# Patient Record
Sex: Female | Born: 1990 | Race: White | Hispanic: No | Marital: Single | Smoking: Current every day smoker
Health system: Southern US, Community
[De-identification: ages and names within clinical notes are randomized; demographics above are authoritative.]

## PROBLEM LIST (undated history)

## (undated) DIAGNOSIS — B1920 Unspecified viral hepatitis C without hepatic coma: Secondary | ICD-10-CM

## (undated) HISTORY — PX: CHOLECYSTECTOMY: SHX55

## (undated) HISTORY — PX: TOOTH EXTRACTION: SUR596

---
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
## 2012-05-02 ENCOUNTER — Encounter (HOSPITAL_COMMUNITY): Payer: Self-pay | Admitting: *Deleted

## 2012-05-02 ENCOUNTER — Emergency Department (HOSPITAL_COMMUNITY)
Admission: EM | Admit: 2012-05-02 | Discharge: 2012-05-02 | Disposition: A | Discharge status: Home / Self Care | Payer: Medicaid Other | Attending: Emergency Medicine | Admitting: Emergency Medicine

## 2012-05-02 ENCOUNTER — Emergency Department (HOSPITAL_COMMUNITY): Payer: Medicaid Other

## 2012-05-02 DIAGNOSIS — M79672 Pain in left foot: Secondary | ICD-10-CM

## 2012-05-02 DIAGNOSIS — M79609 Pain in unspecified limb: Secondary | ICD-10-CM | POA: Insufficient documentation

## 2012-05-02 DIAGNOSIS — X500XXA Overexertion from strenuous movement or load, initial encounter: Secondary | ICD-10-CM | POA: Insufficient documentation

## 2012-05-02 DIAGNOSIS — F17200 Nicotine dependence, unspecified, uncomplicated: Secondary | ICD-10-CM | POA: Insufficient documentation

## 2012-05-02 DIAGNOSIS — M25572 Pain in left ankle and joints of left foot: Secondary | ICD-10-CM

## 2012-05-02 DIAGNOSIS — M25579 Pain in unspecified ankle and joints of unspecified foot: Secondary | ICD-10-CM | POA: Insufficient documentation

## 2012-05-02 MED ORDER — HYDROCODONE-ACETAMINOPHEN 5-325 MG PO TABS: 1.0000 | ORAL_TABLET | Freq: Four times a day (QID) | ORAL | Status: AC | PRN

## 2012-05-02 NOTE — ED Notes (Addendum)
Pt states left foot pain, mostly along lateral side. No known injury. Mild swelling to foot and ankle area. Pt is four days post pardum with no complications.

## 2012-05-02 NOTE — ED Provider Notes (Signed)
History     CSN: [id number]  Arrival date & time 05/02/12  1058   First MD Initiated Contact with Patient 05/02/12 1130      Chief Complaint  Patient presents with  . Foot Pain    (Consider location/radiation/quality/duration/timing/severity/associated sxs/prior treatment) HPI Comments: Pt in 4 days postpartum.  She does not recall a specific injury to the L foot and ankle but does recall jumping up and "running" when her baby cried because the dog was near the [place].  Patient is a 21 y.o. female presenting with lower extremity pain. The history is provided by the patient. No language interpreter was used.  Foot Pain This is a new problem. Episode onset: 2 days ago. The problem occurs constantly. Pertinent negatives include no numbness or weakness. The symptoms are aggravated by walking and standing. She has tried NSAIDs for the symptoms. The treatment provided mild relief.    History reviewed. No pertinent past medical history.  Past Surgical History  Procedure Date  . Cholecystectomy   . Tooth extraction     No family history on file.  History  Substance Use Topics  . Smoking status: Current Everyday Smoker  . Smokeless tobacco: Not on file  . Alcohol Use: No    OB History    Grav Para Term Preterm Abortions TAB SAB Ect Mult Living                  Review of Systems  Musculoskeletal:       Foot and ankle pain   Neurological: Negative for weakness and numbness.  All other systems reviewed and are negative.    Allergies  Review of patient's allergies indicates no known allergies.  Home Medications  No current outpatient prescriptions on file.  BP 135/72  Pulse 87  Temp 98.5 F (36.9 C) (Oral)  Resp 16  Ht 5\' 6"  (1.676 m)  Wt 200 lb (90.719 kg)  BMI 32.28 kg/m2  SpO2 100%  LMP 05/02/2012  Physical Exam  Nursing note and vitals reviewed. Constitutional: She is oriented to person, place, and time. She appears well-developed and well-nourished.  No distress.  HENT:  Head: Normocephalic and atraumatic.  Eyes: EOM are normal.  Neck: Normal range of motion.  Cardiovascular: Normal rate, regular rhythm and normal heart sounds.   Pulmonary/Chest: Effort normal and breath sounds normal.  Abdominal: Soft. She exhibits no distension. There is no tenderness.  Musculoskeletal: She exhibits tenderness.       Left foot: She exhibits decreased range of motion, tenderness, bony tenderness and swelling. She exhibits normal capillary refill, no crepitus, no deformity and no laceration.       Feet:  Neurological: She is alert and oriented to person, place, and time.  Skin: Skin is warm and dry.  Psychiatric: She has a normal mood and affect. Judgment normal.    ED Course  Procedures (including critical care time)  Labs Reviewed - No data to display Dg Ankle Complete Left  05/02/2012  *RADIOLOGY REPORT*  Clinical Data: Lateral foot and ankle pain.  LEFT ANKLE COMPLETE - 3+ VIEW  Comparison: None.  Findings: Imaged bones, joints and soft tissues appear normal.  IMPRESSION: Negative exam.  Original Report Authenticated By: Bernadene Bell. D'ALESSIO, M.D.   Dg Foot Complete Left  05/02/2012  *RADIOLOGY REPORT*  Clinical Data: Lateral foot and ankle pain.  LEFT FOOT - COMPLETE 3+ VIEW  Comparison: None.  Findings: Imaged bones, joints and soft tissues appear normal.  IMPRESSION: Negative exam.  Original Report Authenticated By: Bernadene Bell. D'ALESSIO, M.D.     1. Left foot pain   2. Left ankle pain       MDM  No fxs.  Ice, elevate, post op shoe Ibuprofen 800 mg TID.  F/u with dr. Romeo Apple.        Evalina Field, [place] 05/02/12 1244

## 2012-05-03 NOTE — ED Provider Notes (Signed)
Medical screening examination/treatment/procedure(s) were performed by non-physician practitioner and as supervising physician I was immediately available for consultation/collaboration.   Ceceilia Cephus M Prudence Heiny, DO 05/03/12 2051 

## 2012-05-11 ENCOUNTER — Emergency Department (HOSPITAL_COMMUNITY)
Admission: EM | Admit: 2012-05-11 | Discharge: 2012-05-11 | Disposition: A | Discharge status: Home / Self Care | Payer: Medicaid Other | Attending: Emergency Medicine | Admitting: Emergency Medicine

## 2012-05-11 ENCOUNTER — Encounter (HOSPITAL_COMMUNITY): Payer: Self-pay | Admitting: *Deleted

## 2012-05-11 DIAGNOSIS — Y9389 Activity, other specified: Secondary | ICD-10-CM | POA: Insufficient documentation

## 2012-05-11 DIAGNOSIS — S335XXA Sprain of ligaments of lumbar spine, initial encounter: Secondary | ICD-10-CM | POA: Insufficient documentation

## 2012-05-11 DIAGNOSIS — X500XXA Overexertion from strenuous movement or load, initial encounter: Secondary | ICD-10-CM | POA: Insufficient documentation

## 2012-05-11 DIAGNOSIS — Y998 Other external cause status: Secondary | ICD-10-CM | POA: Insufficient documentation

## 2012-05-11 DIAGNOSIS — S39012A Strain of muscle, fascia and tendon of lower back, initial encounter: Secondary | ICD-10-CM

## 2012-05-11 DIAGNOSIS — F17200 Nicotine dependence, unspecified, uncomplicated: Secondary | ICD-10-CM | POA: Insufficient documentation

## 2012-05-11 NOTE — ED Notes (Signed)
State she bent over to pick up her baby 2 days ago and felt something pop in her back

## 2012-05-11 NOTE — ED Notes (Signed)
Pt with lower left back pain after jumping up to pick up her newborn baby, states pain radiates down left leg

## 2012-05-11 NOTE — ED Notes (Signed)
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

## 2012-05-11 NOTE — ED Provider Notes (Signed)
History     CSN: [id number]  Arrival date & time 05/11/12  1404   First MD Initiated Contact with Patient 05/11/12 1503      Chief Complaint  Patient presents with  . Back Pain    (Consider location/radiation/quality/duration/timing/severity/associated sxs/prior treatment) Patient is a 21 y.o. female presenting with back pain. The history is provided by the patient and a relative.  Back Pain  This is a new problem. The current episode started 2 days ago. The problem occurs hourly. The problem has been gradually worsening. The pain is associated with lifting heavy objects. The pain is present in the lumbar spine. The quality of the pain is described as aching and shooting. The pain is moderate. The symptoms are aggravated by certain positions. The pain is the same all the time. Pertinent negatives include no chest pain, no fever, no abdominal pain, no bowel incontinence, no perianal numbness, no bladder incontinence and no dysuria. She has tried NSAIDs for the symptoms.    History reviewed. No pertinent past medical history.  Past Surgical History  Procedure Date  . Cholecystectomy   . Tooth extraction     No family history on file.  History  Substance Use Topics  . Smoking status: Current Everyday Smoker  . Smokeless tobacco: Not on file  . Alcohol Use: No    OB History    Grav Para Term Preterm Abortions TAB SAB Ect Mult Living                  Review of Systems  Constitutional: Negative for fever and activity change.       All ROS Neg except as noted in HPI  HENT: Negative for nosebleeds and neck pain.   Eyes: Negative for photophobia and discharge.  Respiratory: Negative for cough, shortness of breath and wheezing.   Cardiovascular: Negative for chest pain and palpitations.  Gastrointestinal: Negative for abdominal pain, blood in stool and bowel incontinence.  Genitourinary: Negative for bladder incontinence, dysuria, frequency and hematuria.  Musculoskeletal:  Positive for back pain. Negative for arthralgias.  Skin: Negative.   Neurological: Negative for dizziness, seizures and speech difficulty.  Psychiatric/Behavioral: Negative for hallucinations and confusion.    Allergies  Review of patient's allergies indicates no known allergies.  Home Medications   Current Outpatient Rx  Name Route Sig Dispense Refill  . GOODY HEADACHE PO Oral Take 1 packet by mouth as needed.    . IBUPROFEN 200 MG PO TABS Oral Take 200 mg by mouth every 6 (six) hours as needed.    Marland Kitchen PRENATAL PLUS 27-1 MG PO TABS Oral Take 1 tablet by mouth daily.    Marland Kitchen HYDROCODONE-ACETAMINOPHEN 5-325 MG PO TABS Oral Take 1 tablet by mouth every 6 (six) hours as needed for pain. 20 tablet 0    BP 116/63  Pulse 99  Temp 98.9 F (37.2 C) (Oral)  Resp 20  Ht 5\' 6"  (1.676 m)  Wt 200 lb (90.719 kg)  BMI 32.28 kg/m2  SpO2 100%  LMP 05/02/2012  Physical Exam  Nursing note and vitals reviewed. Constitutional: She is oriented to person, place, and time. She appears well-developed and well-nourished.  Non-toxic appearance.  HENT:  Head: Normocephalic.  Right Ear: Tympanic membrane and external ear normal.  Left Ear: Tympanic membrane and external ear normal.  Eyes: EOM and lids are normal. Pupils are equal, round, and reactive to light.  Neck: Normal range of motion. Neck supple. Carotid bruit is not present.  Cardiovascular: Normal rate,  regular rhythm, normal heart sounds, intact distal pulses and normal pulses.   Pulmonary/Chest: Breath sounds normal. No respiratory distress.  Abdominal: Soft. Bowel sounds are normal. There is no tenderness. There is no guarding.  Musculoskeletal: Normal range of motion.       Left paraspinal tenderness to palpation, some spasm with attempted range of motion.  Lymphadenopathy:       Head (right side): No submandibular adenopathy present.       Head (left side): No submandibular adenopathy present.    She has no cervical adenopathy.    Neurological: She is alert and oriented to person, place, and time. She has normal strength. No cranial nerve deficit or sensory deficit. She exhibits normal muscle tone. Coordination normal.  Skin: Skin is warm and dry.  Psychiatric: She has a normal mood and affect. Her speech is normal.    ED Course  Procedures (including critical care time)  Labs Reviewed - No data to display No results found.   1. Lumbar strain       MDM  I have reviewed nursing notes, vital signs, and all appropriate lab and imaging results for this patient. Patient was lifting her baby over a plate pin when she felt a pop in her lower back. She's been having pain and spasm since that time. The patient was examined and there no gross neurologic deficits appreciated at this time. The patient is to alternate heat and ice to the lower back. She is to continue her ibuprofen. Muscle relaxant was offered, but the patient states she is concerned about being too drowsy to take care of her child. So the patient is going to use more rest for her back. Patient is also given a referral to orthopedics for evaluation if not improving.       Kathie Dike, [place] 05/11/12 1540

## 2012-05-12 NOTE — ED Provider Notes (Signed)
Medical screening examination/treatment/procedure(s) were performed by non-physician practitioner and as supervising physician I was immediately available for consultation/collaboration.   Carleene Cooper III, MD 05/12/12 [phone]

## 2012-07-22 ENCOUNTER — Emergency Department (HOSPITAL_COMMUNITY)
Admission: EM | Admit: 2012-07-22 | Discharge: 2012-07-22 | Disposition: A | Discharge status: Home / Self Care | Payer: Medicaid Other | Attending: Emergency Medicine | Admitting: Emergency Medicine

## 2012-07-22 ENCOUNTER — Encounter (HOSPITAL_COMMUNITY): Payer: Self-pay

## 2012-07-22 DIAGNOSIS — K0889 Other specified disorders of teeth and supporting structures: Secondary | ICD-10-CM

## 2012-07-22 DIAGNOSIS — F17200 Nicotine dependence, unspecified, uncomplicated: Secondary | ICD-10-CM | POA: Insufficient documentation

## 2012-07-22 DIAGNOSIS — K089 Disorder of teeth and supporting structures, unspecified: Secondary | ICD-10-CM | POA: Insufficient documentation

## 2012-07-22 MED ORDER — HYDROCODONE-ACETAMINOPHEN 5-325 MG PO TABS
1.0000 | ORAL_TABLET | Freq: Once | ORAL | Status: AC
  Administered 2012-07-22: 1 via ORAL
  Filled 2012-07-22: qty 1

## 2012-07-22 MED ORDER — PENICILLIN V POTASSIUM 500 MG PO TABS: 500.0000 mg | ORAL_TABLET | Freq: Four times a day (QID) | ORAL | Status: AC

## 2012-07-22 MED ORDER — PENICILLIN V POTASSIUM 250 MG PO TABS
500.0000 mg | ORAL_TABLET | Freq: Once | ORAL | Status: AC
  Administered 2012-07-22: 500 mg via ORAL
  Filled 2012-07-22: qty 2

## 2012-07-22 MED ORDER — HYDROCODONE-ACETAMINOPHEN 5-325 MG PO TABS: 1.0000 | ORAL_TABLET | Freq: Four times a day (QID) | ORAL | Status: AC | PRN

## 2012-07-22 NOTE — ED Notes (Signed)
C/o lower dental pain in front.

## 2012-07-22 NOTE — ED Provider Notes (Signed)
Medical screening examination/treatment/procedure(s) were performed by non-physician practitioner and as supervising physician I was immediately available for consultation/collaboration.  Jones Skene, M.D.     Jones Skene, MD 07/22/12 1610

## 2012-07-22 NOTE — ED Notes (Signed)
Patient with no complaints at this time. Respirations even and unlabored. Skin warm/dry. Discharge instructions reviewed with patient at this time. Patient given opportunity to voice concerns/ask questions. Patient discharged at this time and left Emergency Department with steady gait.   

## 2012-07-22 NOTE — ED Provider Notes (Signed)
History     CSN: [id number]  Arrival date & time 07/22/12  1208   First MD Initiated Contact with Patient 07/22/12 1405      Chief Complaint  Patient presents with  . Dental Pain    (Consider location/radiation/quality/duration/timing/severity/associated sxs/prior treatment) HPI Comments: States she is planning to call dr. Barbette Merino , oral surgeon tomorrow.  No fever or chills.  taking #2 800 mg ibuprofen every 8 hrs with no relief.  Patient is a 21 y.o. female presenting with tooth pain. The history is provided by the patient. No language interpreter was used.  Dental PainThe primary symptoms include mouth pain. Primary symptoms do not include dental injury. Episode onset: several days. The symptoms are unchanged. The symptoms occur constantly.  Additional symptoms do not include: facial swelling and trouble swallowing.    History reviewed. No pertinent past medical history.  Past Surgical History  Procedure Date  . Cholecystectomy   . Tooth extraction     No family history on file.  History  Substance Use Topics  . Smoking status: Current Every Day Smoker  . Smokeless tobacco: Not on file  . Alcohol Use: No    OB History    Grav Para Term Preterm Abortions TAB SAB Ect Mult Living                  Review of Systems  HENT: Positive for dental problem. Negative for facial swelling and trouble swallowing.   Cardiovascular: Negative for chest pain.  All other systems reviewed and are negative.    Allergies  Review of patient's allergies indicates no known allergies.  Home Medications   Current Outpatient Rx  Name Route Sig Dispense Refill  . GOODY HEADACHE PO Oral Take 1 packet by mouth every 4 (four) hours as needed. Pain    . ESCITALOPRAM OXALATE 10 MG PO TABS Oral Take 10 mg by mouth daily.    . IBUPROFEN 200 MG PO TABS Oral Take 400 mg by mouth every 6 (six) hours as needed. Pain    . HYDROCODONE-ACETAMINOPHEN 5-325 MG PO TABS Oral Take 1 tablet by mouth  every 6 (six) hours as needed for pain. 20 tablet 0  . PENICILLIN V POTASSIUM 500 MG PO TABS Oral Take 1 tablet (500 mg total) by mouth 4 (four) times daily. 40 tablet 0    BP 129/96  Pulse 98  Temp 98.8 F (37.1 C) (Oral)  Resp 17  Ht 5\' 6"  (1.676 m)  Wt 200 lb (90.719 kg)  BMI 32.28 kg/m2  SpO2 100%  LMP 06/28/2012  Physical Exam  Nursing note and vitals reviewed. Constitutional: She is oriented to person, place, and time. She appears well-developed and well-nourished. No distress.  HENT:  Head: Normocephalic and atraumatic.  Mouth/Throat: Uvula is midline, oropharynx is clear and moist and mucous membranes are normal. Dental caries present. No dental abscesses or uvula swelling.    Eyes: EOM are normal.  Neck: Normal range of motion.  Cardiovascular: Normal rate and regular rhythm.   Pulmonary/Chest: Effort normal.  Abdominal: Soft. She exhibits no distension. There is no tenderness.  Musculoskeletal: Normal range of motion.  Neurological: She is alert and oriented to person, place, and time.  Skin: Skin is warm and dry.  Psychiatric: She has a normal mood and affect. Judgment normal.    ED Course  Procedures (including critical care time)  Labs Reviewed - No data to display No results found.   1. Pain, dental  MDM  rx-pen VK 500 mg QID, 40 rx-hydrocodone, 20 Ibuprofen F/u with dr. Hamilton Capri, PA 07/22/12 [phone]

## 2012-11-27 ENCOUNTER — Emergency Department (HOSPITAL_COMMUNITY)
Admission: EM | Admit: 2012-11-27 | Discharge: 2012-11-27 | Disposition: A | Discharge status: Home / Self Care | Payer: Medicaid Other | Attending: Emergency Medicine | Admitting: Emergency Medicine

## 2012-11-27 ENCOUNTER — Emergency Department (HOSPITAL_COMMUNITY): Payer: Medicaid Other

## 2012-11-27 ENCOUNTER — Encounter (HOSPITAL_COMMUNITY): Payer: Self-pay | Admitting: *Deleted

## 2012-11-27 DIAGNOSIS — F17200 Nicotine dependence, unspecified, uncomplicated: Secondary | ICD-10-CM | POA: Insufficient documentation

## 2012-11-27 DIAGNOSIS — S39012A Strain of muscle, fascia and tendon of lower back, initial encounter: Secondary | ICD-10-CM

## 2012-11-27 DIAGNOSIS — W010XXA Fall on same level from slipping, tripping and stumbling without subsequent striking against object, initial encounter: Secondary | ICD-10-CM | POA: Insufficient documentation

## 2012-11-27 DIAGNOSIS — Z79899 Other long term (current) drug therapy: Secondary | ICD-10-CM | POA: Insufficient documentation

## 2012-11-27 DIAGNOSIS — S335XXA Sprain of ligaments of lumbar spine, initial encounter: Secondary | ICD-10-CM | POA: Insufficient documentation

## 2012-11-27 DIAGNOSIS — Y929 Unspecified place or not applicable: Secondary | ICD-10-CM | POA: Insufficient documentation

## 2012-11-27 DIAGNOSIS — T148XXA Other injury of unspecified body region, initial encounter: Secondary | ICD-10-CM

## 2012-11-27 DIAGNOSIS — Y939 Activity, unspecified: Secondary | ICD-10-CM | POA: Insufficient documentation

## 2012-11-27 MED ORDER — HYDROCODONE-ACETAMINOPHEN 5-325 MG PO TABS: 1.0000 | ORAL_TABLET | ORAL | Status: DC | PRN

## 2012-11-27 NOTE — ED Provider Notes (Signed)
History     CSN: [id number]  Arrival date & time 11/27/12  1500   None     Chief Complaint  Patient presents with  . Fall  . Back Pain    HPI Allison Love is a 22 y.o. female who presents to the ED with back pain. The onset was sudden. She slipped on ice this morning and started to fall, she caught herself and did not land on the the ground. Denies head injury or LOC. Denies nausea or vomiting. Severe pain in lower back.   History reviewed. No pertinent past medical history.  Past Surgical History  Procedure Laterality Date  . Cholecystectomy    . Tooth extraction      History reviewed. No pertinent family history.  History  Substance Use Topics  . Smoking status: Current Every Day Smoker  . Smokeless tobacco: Not on file  . Alcohol Use: No    OB History   Grav Para Term Preterm Abortions TAB SAB Ect Mult Living                  Review of Systems  Constitutional: Negative for fever and chills.  HENT: Negative for neck pain.   Eyes: Negative for visual disturbance.  Respiratory: Negative for cough.   Cardiovascular: Negative for chest pain and palpitations.  Gastrointestinal: Negative for nausea, vomiting and abdominal pain.  Musculoskeletal: Positive for back pain.  Skin: Negative for wound.  Neurological: Negative for dizziness and headaches.  Psychiatric/Behavioral: Negative for confusion. The patient is not nervous/anxious.     Allergies  Review of patient's allergies indicates no known allergies.  Home Medications   Current Outpatient Rx  Name  Route  Sig  Dispense  Refill  . Aspirin-Acetaminophen-Caffeine (GOODY HEADACHE PO)   Oral   Take 1 packet by mouth every 4 (four) hours as needed. Pain         . busPIRone (BUSPAR) 15 MG tablet   Oral   Take 15 mg by mouth 2 (two) times daily.         . cyclobenzaprine (FLEXERIL) 10 MG tablet   Oral   Take 10 mg by mouth 3 (three) times daily as needed for muscle spasms.         Marland Kitchen  escitalopram (LEXAPRO) 10 MG tablet   Oral   Take 10 mg by mouth daily.         Marland Kitchen ibuprofen (ADVIL,MOTRIN) 200 MG tablet   Oral   Take 400 mg by mouth every 6 (six) hours as needed. Pain         . topiramate (TOPAMAX) 50 MG tablet   Oral   Take 50 mg by mouth 2 (two) times daily.           BP 119/66  Pulse 86  Temp(Src) 97.9 F (36.6 C) (Oral)  Resp 20  Ht 5\' 6"  (1.676 m)  Wt 250 lb (113.399 kg)  BMI 40.37 kg/m2  SpO2 100%  LMP 11/22/2012  Physical Exam  Nursing note and vitals reviewed. Constitutional: She is oriented to person, place, and time. She appears well-developed and well-nourished.  HENT:  Head: Normocephalic and atraumatic.  Eyes: EOM are normal. Pupils are equal, round, and reactive to light.  Neck: Neck supple.  Cardiovascular: Normal rate.   Pulmonary/Chest: Effort normal.  Abdominal: Soft. There is no tenderness.  Musculoskeletal:  Tender with palpation left lumbar area. Pain increases with range of motion. Pain with straight leg raises left. Pedal pulses strong  and equal bilateral, adequate circulation, good touch sensation. Normal reflexes.  Neurological: She is alert and oriented to person, place, and time. She has normal strength and normal reflexes. No cranial nerve deficit or sensory deficit.  Ambulatory without difficulty.  Skin: Skin is warm and dry.  Psychiatric: She has a normal mood and affect. Her behavior is normal. Judgment and thought content normal.    Procedures  Dg Lumbar Spine Complete  11/27/2012  *RADIOLOGY REPORT*  Clinical Data: Fall.  Back pain.  LUMBAR SPINE - COMPLETE 4+ VIEW  Comparison: None.  Findings: Minimal curvature of the lumbar spine.  No fracture detected.  No significant disc space narrowing.  Left renal calculi suspected.  Prior cholecystectomy.  IMPRESSION: Minimal curvature of the lumbar spine.  No fracture detected.  No significant disc space narrowing.  Left renal calculi suspected.   Original Report  Authenticated By: Lacy Duverney, M.D.     Assessment: 22 y.o. female with low back strain   Renal calculi as incidental finding on x-ray  Plan:  Motrin 600 mg  And Flexeril (patient has at home)   Hydrocodone Rx I have reviewed this patient's vital signs, nurses notes and imaging.  I discussed findings with the patient and plan of care. Patient voices understanding.   Medication List    TAKE these medications       HYDROcodone-acetaminophen 5-325 MG per tablet  Commonly known as:  NORCO/VICODIN  Take 1 tablet by mouth every 4 (four) hours as needed for pain.      ASK your doctor about these medications       busPIRone 15 MG tablet  Commonly known as:  BUSPAR  Take 15 mg by mouth 2 (two) times daily.     cyclobenzaprine 10 MG tablet  Commonly known as:  FLEXERIL  Take 10 mg by mouth 3 (three) times daily as needed for muscle spasms.     escitalopram 10 MG tablet  Commonly known as:  LEXAPRO  Take 10 mg by mouth daily.     GOODY HEADACHE PO  Take 1 packet by mouth every 4 (four) hours as needed. Pain     ibuprofen 200 MG tablet  Commonly known as:  ADVIL,MOTRIN  Take 400 mg by mouth every 6 (six) hours as needed. Pain     topiramate 50 MG tablet  Commonly known as:  TOPAMAX  Take 50 mg by mouth 2 (two) times daily.            [facility] Orlene Och, [place] 11/27/12 [phone]

## 2012-11-27 NOTE — ED Notes (Signed)
Pt slipped on ice this morning and fell, caught self and did not land on ground, denies hitting her head, c/o lower back pain

## 2012-11-27 NOTE — ED Notes (Signed)
Pt c/o lower back pain that began after she slipped on ice and fell this morning. Pt was ambulatory on arrival.

## 2012-11-27 NOTE — ED Provider Notes (Signed)
Medical screening examination/treatment/procedure(s) were performed by non-physician practitioner and as supervising physician I was immediately available for consultation/collaboration.   Donald W Wickline, MD 11/27/12 1918 

## 2013-01-21 ENCOUNTER — Encounter (HOSPITAL_COMMUNITY): Payer: Self-pay | Admitting: *Deleted

## 2013-01-21 ENCOUNTER — Emergency Department (HOSPITAL_COMMUNITY)
Admission: EM | Admit: 2013-01-21 | Discharge: 2013-01-21 | Disposition: A | Discharge status: Home / Self Care | Payer: Medicaid Other | Attending: Emergency Medicine | Admitting: Emergency Medicine

## 2013-01-21 DIAGNOSIS — K0889 Other specified disorders of teeth and supporting structures: Secondary | ICD-10-CM

## 2013-01-21 DIAGNOSIS — K089 Disorder of teeth and supporting structures, unspecified: Secondary | ICD-10-CM | POA: Insufficient documentation

## 2013-01-21 DIAGNOSIS — K029 Dental caries, unspecified: Secondary | ICD-10-CM

## 2013-01-21 DIAGNOSIS — Z79899 Other long term (current) drug therapy: Secondary | ICD-10-CM | POA: Insufficient documentation

## 2013-01-21 DIAGNOSIS — K08109 Complete loss of teeth, unspecified cause, unspecified class: Secondary | ICD-10-CM | POA: Insufficient documentation

## 2013-01-21 DIAGNOSIS — F17200 Nicotine dependence, unspecified, uncomplicated: Secondary | ICD-10-CM | POA: Insufficient documentation

## 2013-01-21 MED ORDER — PENICILLIN V POTASSIUM 500 MG PO TABS: 1000.0000 mg | ORAL_TABLET | Freq: Two times a day (BID) | ORAL | Status: DC

## 2013-01-21 MED ORDER — HYDROCODONE-ACETAMINOPHEN 5-325 MG PO TABS: 2.0000 | ORAL_TABLET | ORAL | Status: DC | PRN

## 2013-01-21 NOTE — ED Provider Notes (Signed)
History     CSN: [id number]  Arrival date & time 01/21/13  1714   First MD Initiated Contact with Patient 01/21/13 1739      Chief Complaint  Patient presents with  . Dental Pain    (Consider location/radiation/quality/duration/timing/severity/associated sxs/prior treatment) HPI This 22 year old female has had multiple dental extractions from decay in the past, she does have some mandibular incisors with chronic dental caries usually do not hurt her, over the last several days she has constant pain worse with chewing in her mandibular dental incisors with tenderness to percussion and palpation but no trismus no drooling no neck swelling no fever no voice change no shortness of breath no trauma and the patient states no recent antibiotics, she states her dentist will not see her until she sees an oral surgeon and she states that oral surg appointment is within 2 weeks. There is no treatment prior to arrival her pain is severe. History reviewed. No pertinent past medical history.  Past Surgical History  Procedure Laterality Date  . Cholecystectomy    . Tooth extraction      History reviewed. No pertinent family history.  History  Substance Use Topics  . Smoking status: Current Every Day Smoker  . Smokeless tobacco: Not on file  . Alcohol Use: No    OB History   Grav Para Term Preterm Abortions TAB SAB Ect Mult Living                  Review of Systems 10 Systems reviewed and are negative for acute change except as noted in the HPI. Allergies  Review of patient's allergies indicates no known allergies.  Home Medications   Current Outpatient Rx  Name  Route  Sig  Dispense  Refill  . Aspirin-Acetaminophen-Caffeine (GOODY HEADACHE PO)   Oral   Take 1 packet by mouth every 4 (four) hours as needed. Pain         . busPIRone (BUSPAR) 15 MG tablet   Oral   Take 15 mg by mouth 2 (two) times daily.         . cyclobenzaprine (FLEXERIL) 10 MG tablet   Oral   Take 10  mg by mouth 3 (three) times daily as needed for muscle spasms.         Marland Kitchen escitalopram (LEXAPRO) 10 MG tablet   Oral   Take 10 mg by mouth daily.         Marland Kitchen HYDROcodone-acetaminophen (NORCO) 5-325 MG per tablet   Oral   Take 2 tablets by mouth every 4 (four) hours as needed for pain.   10 tablet   0   . HYDROcodone-acetaminophen (NORCO/VICODIN) 5-325 MG per tablet   Oral   Take 1 tablet by mouth every 4 (four) hours as needed for pain.   15 tablet   0   . ibuprofen (ADVIL,MOTRIN) 200 MG tablet   Oral   Take 400 mg by mouth every 6 (six) hours as needed. Pain         . penicillin v potassium (VEETID) 500 MG tablet   Oral   Take 2 tablets (1,000 mg total) by mouth 2 (two) times daily. X 7 days   28 tablet   0   . topiramate (TOPAMAX) 50 MG tablet   Oral   Take 50 mg by mouth 2 (two) times daily.           BP 118/78  Pulse 77  Temp(Src) 98.8 F (37.1 C) (Oral)  Resp  18  Ht 5\' 6"  (1.676 m)  Wt 250 lb (113.399 kg)  BMI 40.37 kg/m2  SpO2 100%  LMP 01/14/2013  Physical Exam  Nursing note and vitals reviewed. Constitutional:  Awake, alert, nontoxic appearance.  HENT:  Head: Atraumatic.  Mouth/Throat: Oropharynx is clear and moist.  Prior multiple dental extractions, the patient wears upper dentures, her remaining right mandibular molars are nontender she does have some remaining mandibular incisors which are eroded away from decay with multiple dental caries of the remaining 4 mandibular incisors with percussion tenderness and localized gingival tenderness without purulence drainage or fluctuance no drooling no stridor no trismus her tongue appears normal  Eyes: Right eye exhibits no discharge. Left eye exhibits no discharge.  Neck: Neck supple.  Cardiovascular: Normal rate and regular rhythm.   No murmur heard. Pulmonary/Chest: Effort normal and breath sounds normal. No respiratory distress. She has no wheezes. She has no rales. She exhibits no tenderness.   Abdominal: Soft. There is no tenderness. There is no rebound.  Musculoskeletal: She exhibits no tenderness.  Baseline ROM, no obvious new focal weakness.  Lymphadenopathy:    She has no cervical adenopathy.  Neurological: She is alert.  Mental status and motor strength appears baseline for patient and situation.  Skin: No rash noted.  Psychiatric: She has a normal mood and affect.    ED Course  Procedures (including critical care time)  Labs Reviewed - No data to display No results found.   1. Pain, dental   2. Dental caries       MDM  I doubt any other EMC precluding discharge at this time including, but not necessarily limited to the following:deep space neck infection.         Hurman Horn, MD 01/24/13 [phone]

## 2013-01-21 NOTE — ED Notes (Signed)
Patient with no complaints at this time. Respirations even and unlabored. Skin warm/dry. Discharge instructions reviewed with patient at this time. Patient given opportunity to voice concerns/ask questions. Patient discharged at this time and left Emergency Department with steady gait.   

## 2013-01-21 NOTE — ED Notes (Signed)
Dental pain for 1 week,

## 2013-10-04 ENCOUNTER — Emergency Department (HOSPITAL_COMMUNITY)
Admission: EM | Admit: 2013-10-04 | Discharge: 2013-10-05 | Disposition: A | Discharge status: Home / Self Care | Payer: Medicaid Other | Attending: Emergency Medicine | Admitting: Emergency Medicine

## 2013-10-04 DIAGNOSIS — K029 Dental caries, unspecified: Secondary | ICD-10-CM | POA: Insufficient documentation

## 2013-10-04 DIAGNOSIS — Y929 Unspecified place or not applicable: Secondary | ICD-10-CM | POA: Insufficient documentation

## 2013-10-04 DIAGNOSIS — IMO0002 Reserved for concepts with insufficient information to code with codable children: Secondary | ICD-10-CM | POA: Insufficient documentation

## 2013-10-04 DIAGNOSIS — Z79899 Other long term (current) drug therapy: Secondary | ICD-10-CM | POA: Insufficient documentation

## 2013-10-04 DIAGNOSIS — S025XXA Fracture of tooth (traumatic), initial encounter for closed fracture: Secondary | ICD-10-CM | POA: Insufficient documentation

## 2013-10-04 DIAGNOSIS — Y939 Activity, unspecified: Secondary | ICD-10-CM | POA: Insufficient documentation

## 2013-10-04 DIAGNOSIS — K0889 Other specified disorders of teeth and supporting structures: Secondary | ICD-10-CM

## 2013-10-04 DIAGNOSIS — F17200 Nicotine dependence, unspecified, uncomplicated: Secondary | ICD-10-CM | POA: Insufficient documentation

## 2013-10-05 ENCOUNTER — Encounter (HOSPITAL_COMMUNITY): Payer: Self-pay | Admitting: Emergency Medicine

## 2013-10-05 MED ORDER — NAPROXEN 500 MG PO TABS: 500.0000 mg | ORAL_TABLET | Freq: Two times a day (BID) | ORAL | Status: DC

## 2013-10-05 MED ORDER — PENICILLIN V POTASSIUM 500 MG PO TABS: 500.0000 mg | ORAL_TABLET | Freq: Four times a day (QID) | ORAL | Status: DC

## 2013-10-05 MED ORDER — NAPROXEN 250 MG PO TABS
500.0000 mg | ORAL_TABLET | Freq: Once | ORAL | Status: AC
  Administered 2013-10-05: 500 mg via ORAL
  Filled 2013-10-05: qty 2

## 2013-10-05 NOTE — ED Notes (Signed)
Toothache that has been bothering me for 3 days per pt.

## 2013-10-05 NOTE — ED Provider Notes (Signed)
CSN: [id number]     Arrival date & time 10/04/13  2356 History   First MD Initiated Contact with Patient 10/05/13 0011     Chief Complaint  Patient presents with  . Dental Pain   (Consider location/radiation/quality/duration/timing/severity/associated sxs/prior Treatment) HPI Comments: 23 year old female presents with left lower tooth pain. She states that her child had butted her yesterday, today when she removed her partial dentition plate and brushed her teeth she had excessive bleeding from the tooth which has since resolved spontaneously. She has had ongoing pain in that tooth throughout the day but denies swelling of the jaw, difficulty opening or difficulty chewing.  Patient is a 23 y.o. female presenting with tooth pain. The history is provided by the patient.  Dental Pain Associated symptoms: no facial swelling and no fever     History reviewed. No pertinent past medical history. Past Surgical History  Procedure Laterality Date  . Cholecystectomy    . Tooth extraction     History reviewed. No pertinent family history. History  Substance Use Topics  . Smoking status: Current Every Day Smoker  . Smokeless tobacco: Not on file  . Alcohol Use: No   OB History   Grav Para Term Preterm Abortions TAB SAB Ect Mult Living                 Review of Systems  Constitutional: Negative for fever and chills.  HENT: Positive for dental problem. Negative for facial swelling, sore throat, trouble swallowing and voice change.        Toothache  Gastrointestinal: Negative for nausea and vomiting.    Allergies  Tramadol  Home Medications   Current Outpatient Rx  Name  Route  Sig  Dispense  Refill  . Aspirin-Acetaminophen-Caffeine (GOODY HEADACHE PO)   Oral   Take 1 packet by mouth every 4 (four) hours as needed. Pain         . ibuprofen (ADVIL,MOTRIN) 200 MG tablet   Oral   Take 400 mg by mouth every 6 (six) hours as needed. Pain         . busPIRone (BUSPAR) 15 MG  tablet   Oral   Take 15 mg by mouth 2 (two) times daily.         . cyclobenzaprine (FLEXERIL) 10 MG tablet   Oral   Take 10 mg by mouth 3 (three) times daily as needed for muscle spasms.         Marland Kitchen escitalopram (LEXAPRO) 10 MG tablet   Oral   Take 10 mg by mouth daily.         Marland Kitchen HYDROcodone-acetaminophen (NORCO) 5-325 MG per tablet   Oral   Take 2 tablets by mouth every 4 (four) hours as needed for pain.   10 tablet   0   . HYDROcodone-acetaminophen (NORCO/VICODIN) 5-325 MG per tablet   Oral   Take 1 tablet by mouth every 4 (four) hours as needed for pain.   15 tablet   0   . naproxen (NAPROSYN) 500 MG tablet   Oral   Take 1 tablet (500 mg total) by mouth 2 (two) times daily with a meal.   30 tablet   0   . penicillin v potassium (VEETID) 500 MG tablet   Oral   Take 2 tablets (1,000 mg total) by mouth 2 (two) times daily. X 7 days   28 tablet   0   . penicillin v potassium (VEETID) 500 MG tablet   Oral  Take 1 tablet (500 mg total) by mouth 4 (four) times daily.   40 tablet   0   . topiramate (TOPAMAX) 50 MG tablet   Oral   Take 50 mg by mouth 2 (two) times daily.          BP 142/81  Pulse 70  Temp(Src) 98.8 F (37.1 C) (Oral)  Resp 18  Ht 5\' 6"  (1.676 m)  Wt 250 lb (113.399 kg)  BMI 40.37 kg/m2  SpO2 99%  LMP 09/22/2013 Physical Exam  Nursing note and vitals reviewed. Constitutional: She appears well-developed and well-nourished. No distress.  HENT:  Head: Normocephalic and atraumatic.  Mouth/Throat: Oropharynx is clear and moist. No oropharyngeal exudate.  Diffuse dental disease, multiple absent teeth from the lower jaw, left lower tooth isolated, multiple caries present, no laxation present, mild tenderness to percussion over the tooth. No periapical abscesses or jaw asymmetry, no torticollis or trismus. No tenderness underneath the tongue, no elevation of the tongue  Eyes: Conjunctivae are normal. No scleral icterus.  Neck: Normal range of  motion. Neck supple. No thyromegaly present.  Cardiovascular: Normal rate and regular rhythm.   Pulmonary/Chest: Effort normal and breath sounds normal.  Lymphadenopathy:    She has no cervical adenopathy.  Neurological: She is alert.  Skin: Skin is warm and dry. No rash noted. She is not diaphoretic.    ED Course  Procedures (including critical care time) Labs Review Labs Reviewed - No data to display Imaging Review No results found.  EKG Interpretation   None       MDM   1. Toothache    The patient has dental pain, there is no obvious abscess requiring drainage but given the state of repair of the isolated tooth that is left, we'll give antibiotics, anti-inflammatories and followup. Patient states she has followup with dentist in this coming week. Stable for discharge   Meds given in ED:  Medications  naproxen (NAPROSYN) tablet 500 mg (not administered)    New Prescriptions   NAPROXEN (NAPROSYN) 500 MG TABLET    Take 1 tablet (500 mg total) by mouth 2 (two) times daily with a meal.   PENICILLIN V POTASSIUM (VEETID) 500 MG TABLET    Take 1 tablet (500 mg total) by mouth 4 (four) times daily.        Vida RollerBrian D Rodney Yera, MD 10/05/13 [id number]

## 2013-10-05 NOTE — Discharge Instructions (Signed)
Please call your doctor for a followup appointment within 24-48 hours. When you talk to your doctor please let them know that you were seen in the emergency department and have them acquire all of your records so that they can discuss the findings with you and formulate a treatment plan to fully care for your new and ongoing problems. ° °

## 2013-10-30 ENCOUNTER — Emergency Department (HOSPITAL_COMMUNITY)
Admission: EM | Admit: 2013-10-30 | Discharge: 2013-10-30 | Disposition: A | Discharge status: Home / Self Care | Payer: Medicaid Other | Attending: Emergency Medicine | Admitting: Emergency Medicine

## 2013-10-30 ENCOUNTER — Encounter (HOSPITAL_COMMUNITY): Payer: Self-pay | Admitting: Emergency Medicine

## 2013-10-30 DIAGNOSIS — Z79899 Other long term (current) drug therapy: Secondary | ICD-10-CM | POA: Insufficient documentation

## 2013-10-30 DIAGNOSIS — Z791 Long term (current) use of non-steroidal anti-inflammatories (NSAID): Secondary | ICD-10-CM | POA: Insufficient documentation

## 2013-10-30 DIAGNOSIS — J069 Acute upper respiratory infection, unspecified: Secondary | ICD-10-CM

## 2013-10-30 DIAGNOSIS — M549 Dorsalgia, unspecified: Secondary | ICD-10-CM | POA: Insufficient documentation

## 2013-10-30 DIAGNOSIS — R071 Chest pain on breathing: Secondary | ICD-10-CM | POA: Insufficient documentation

## 2013-10-30 DIAGNOSIS — Z792 Long term (current) use of antibiotics: Secondary | ICD-10-CM | POA: Insufficient documentation

## 2013-10-30 DIAGNOSIS — F17200 Nicotine dependence, unspecified, uncomplicated: Secondary | ICD-10-CM | POA: Insufficient documentation

## 2013-10-30 DIAGNOSIS — R0789 Other chest pain: Secondary | ICD-10-CM

## 2013-10-30 MED ORDER — LORATADINE-PSEUDOEPHEDRINE ER 5-120 MG PO TB12: 1.0000 | ORAL_TABLET | Freq: Two times a day (BID) | ORAL | Status: DC

## 2013-10-30 MED ORDER — PROMETHAZINE-CODEINE 6.25-10 MG/5ML PO SYRP: 5.0000 mL | ORAL_SOLUTION | Freq: Four times a day (QID) | ORAL | Status: DC | PRN

## 2013-10-30 MED ORDER — IBUPROFEN 800 MG PO TABS: 800.0000 mg | ORAL_TABLET | Freq: Three times a day (TID) | ORAL | Status: DC

## 2013-10-30 NOTE — Discharge Instructions (Signed)
Please increase fluids. Please wash hands frequently. Please use Claritin-D every 12 hours or 2 times daily for congestion and assistance with cough. Please use promethazine cough medication for assistance with cough. This medication may cause drowsiness, please use with caution. Please use ibuprofen 3-4 times daily for aching and for fever. Chest Wall Pain Chest wall pain is pain in or around the bones and muscles of your chest. It may take up to 6 weeks to get better. It may take longer if you must stay physically active in your work and activities.  CAUSES  Chest wall pain may happen on its own. However, it may be caused by:  A viral illness like the flu.  Injury.  Coughing.  Exercise.  Arthritis.  Fibromyalgia.  Shingles. HOME CARE INSTRUCTIONS   Avoid overtiring physical activity. Try not to strain or perform activities that cause pain. This includes any activities using your chest or your abdominal and side muscles, especially if heavy weights are used.  Put ice on the sore area.  Put ice in a plastic bag.  Place a towel between your skin and the bag.  Leave the ice on for 15-20 minutes per hour while awake for the first 2 days.  Only take over-the-counter or prescription medicines for pain, discomfort, or fever as directed by your caregiver. SEEK IMMEDIATE MEDICAL CARE IF:   Your pain increases, or you are very uncomfortable.  You have a fever.  Your chest pain becomes worse.  You have new, unexplained symptoms.  You have nausea or vomiting.  You feel sweaty or lightheaded.  You have a cough with phlegm (sputum), or you cough up blood. MAKE SURE YOU:   Understand these instructions.  Will watch your condition.  Will get help right away if you are not doing well or get worse. Document Released: 09/12/2005 Document Revised: 12/05/2011 Document Reviewed: 05/09/2011 [facility] Patient Information 2014 [place], [place].  Upper Respiratory Infection, Adult An  upper respiratory infection (URI) is also known as the common cold. It is often caused by a type of germ (virus). Colds are easily spread (contagious). You can pass it to others by kissing, coughing, sneezing, or drinking out of the same glass. Usually, you get better in 1 or 2 weeks.  HOME CARE   Only take medicine as told by your doctor.  Use a warm mist humidifier or breathe in steam from a hot shower.  Drink enough water and fluids to keep your pee (urine) clear or pale yellow.  Get plenty of rest.  Return to work when your temperature is back to normal or as told by your doctor. You may use a face mask and wash your hands to stop your cold from spreading. GET HELP RIGHT AWAY IF:   After the first few days, you feel you are getting worse.  You have questions about your medicine.  You have chills, shortness of breath, or brown or red spit (mucus).  You have yellow or brown snot (nasal discharge) or pain in the face, especially when you bend forward.  You have a fever, puffy (swollen) neck, pain when you swallow, or white spots in the back of your throat.  You have a bad headache, ear pain, sinus pain, or chest pain.  You have a high-pitched whistling sound when you breathe in and out (wheezing).  You have a lasting cough or cough up blood.  You have sore muscles or a stiff neck. MAKE SURE YOU:   Understand these instructions.  Will  watch your condition.  Will get help right away if you are not doing well or get worse. Document Released: 02/29/2008 Document Revised: 12/05/2011 Document Reviewed: 01/17/2011 [facility] Patient Information 2014 [place], [place].

## 2013-10-30 NOTE — ED Notes (Signed)
Pt c/o cough and congestion since Sunday.  Reports has coughed so much thinks pulled a muscle in lower back.  Also c/o left rib pain.

## 2013-10-30 NOTE — ED Provider Notes (Signed)
CSN: [id number]     Arrival date & time 10/30/13  1455 History   First MD Initiated Contact with Patient 10/30/13 1607     Chief Complaint  Patient presents with  . Cough   (Consider location/radiation/quality/duration/timing/severity/associated sxs/prior Treatment) HPI Comments: Patient is a 23 year old female who presents to the emergency department with complaint of cough and congestion. The patient states that this actually started about 2-3 weeks ago, got better, and then got worse approximately 4 days ago. The patient denies any hemoptysis. She denies any high fever. She states however she has been coughing so much that she thinks she may have pulled muscle in her lower back. She denies any respiratory related illnesses. She's not had any surgeries involving her lungs or her back.  Patient is a 23 y.o. female presenting with cough. The history is provided by the patient.  Cough Associated symptoms: no chest pain, no eye discharge, no shortness of breath and no wheezing     History reviewed. No pertinent past medical history. Past Surgical History  Procedure Laterality Date  . Cholecystectomy    . Tooth extraction     No family history on file. History  Substance Use Topics  . Smoking status: Current Every Day Smoker  . Smokeless tobacco: Not on file  . Alcohol Use: No   OB History   Grav Para Term Preterm Abortions TAB SAB Ect Mult Living                 Review of Systems  Constitutional: Negative for activity change.       All ROS Neg except as noted in HPI  HENT: Positive for congestion and sinus pressure. Negative for nosebleeds.   Eyes: Negative for photophobia and discharge.  Respiratory: Positive for cough. Negative for shortness of breath and wheezing.   Cardiovascular: Negative for chest pain and palpitations.  Gastrointestinal: Negative for abdominal pain and blood in stool.  Genitourinary: Negative for dysuria, frequency and hematuria.  Musculoskeletal:  Positive for back pain. Negative for arthralgias and neck pain.  Skin: Negative.   Neurological: Negative for dizziness, seizures and speech difficulty.  Psychiatric/Behavioral: Negative for hallucinations and confusion.    Allergies  Tramadol  Home Medications   Current Outpatient Rx  Name  Route  Sig  Dispense  Refill  . Aspirin-Acetaminophen-Caffeine (GOODY HEADACHE PO)   Oral   Take 1 packet by mouth every 4 (four) hours as needed. Pain         . busPIRone (BUSPAR) 15 MG tablet   Oral   Take 15 mg by mouth 2 (two) times daily.         . cyclobenzaprine (FLEXERIL) 10 MG tablet   Oral   Take 10 mg by mouth 3 (three) times daily as needed for muscle spasms.         Marland Kitchen. escitalopram (LEXAPRO) 10 MG tablet   Oral   Take 10 mg by mouth daily.         Marland Kitchen. HYDROcodone-acetaminophen (NORCO) 5-325 MG per tablet   Oral   Take 2 tablets by mouth every 4 (four) hours as needed for pain.   10 tablet   0   . HYDROcodone-acetaminophen (NORCO/VICODIN) 5-325 MG per tablet   Oral   Take 1 tablet by mouth every 4 (four) hours as needed for pain.   15 tablet   0   . ibuprofen (ADVIL,MOTRIN) 200 MG tablet   Oral   Take 400 mg by mouth every 6 (six)  hours as needed. Pain         . naproxen (NAPROSYN) 500 MG tablet   Oral   Take 1 tablet (500 mg total) by mouth 2 (two) times daily with a meal.   30 tablet   0   . penicillin v potassium (VEETID) 500 MG tablet   Oral   Take 2 tablets (1,000 mg total) by mouth 2 (two) times daily. X 7 days   28 tablet   0   . penicillin v potassium (VEETID) 500 MG tablet   Oral   Take 1 tablet (500 mg total) by mouth 4 (four) times daily.   40 tablet   0   . topiramate (TOPAMAX) 50 MG tablet   Oral   Take 50 mg by mouth 2 (two) times daily.          BP 114/62  Pulse 86  Temp(Src) 98.5 F (36.9 C)  Resp 20  Ht 5\' 6"  (1.676 m)  Wt 250 lb (113.399 kg)  BMI 40.37 kg/m2  SpO2 97%  LMP 10/21/2013 Physical Exam  Nursing note  and vitals reviewed. Constitutional: She is oriented to person, place, and time. She appears well-developed and well-nourished.  Non-toxic appearance.  HENT:  Head: Normocephalic.  Right Ear: Tympanic membrane and external ear normal.  Left Ear: Tympanic membrane and external ear normal.  Nasal congestion present. Airway is patent. No exudate of the posterior pharynx.  Eyes: EOM and lids are normal. Pupils are equal, round, and reactive to light.  Neck: Normal range of motion. Neck supple. Carotid bruit is not present.  Cardiovascular: Normal rate, regular rhythm, normal heart sounds, intact distal pulses and normal pulses.   Pulmonary/Chest: Breath sounds normal. No respiratory distress.  Chest wall soreness with deep breathing and palpation.  Abdominal: Soft. Bowel sounds are normal. There is no tenderness. There is no guarding.  Musculoskeletal: Normal range of motion.  Generalized thoracic and lumbar soreness. No palpable step off.  Lymphadenopathy:       Head (right side): No submandibular adenopathy present.       Head (left side): No submandibular adenopathy present.    She has no cervical adenopathy.  Neurological: She is alert and oriented to person, place, and time. She has normal strength. No cranial nerve deficit or sensory deficit.  Skin: Skin is warm and dry. No rash noted.  Psychiatric: She has a normal mood and affect. Her speech is normal.    ED Course  Procedures (including critical care time) Labs Review Labs Reviewed - No data to display Imaging Review No results found.  EKG Interpretation   None       MDM  No diagnosis found. **I have reviewed nursing notes, vital signs, and all appropriate lab and imaging results for this patient.*  Patient presents with a 3 to four-day history of cough and congestion. She has pain in her rib areas as well as in her back. He requests assistance with the congestion and with the back of the pain cough. Patient also request  a work note to return to work.  There's been no hemoptysis. Is no high fever appreciated today. The pulse oximetry is 97% on room air.  The plan at this time is for the patient to be on Claritin-D every 12 hours. Prescription for promethazine cough medication given to the patient for assistance with her cough. Patient is advised to use ibuprofen every 6 hours for aching and fever.  Kathie Dike, PA-C 10/30/13 [phone]

## 2013-10-30 NOTE — ED Provider Notes (Signed)
Medical screening examination/treatment/procedure(s) were performed by non-physician practitioner and as supervising physician I was immediately available for consultation/collaboration.  EKG Interpretation   None         Mercades Bajaj L Zurisadai Helminiak, MD 10/30/13 2226 

## 2013-12-07 ENCOUNTER — Encounter (HOSPITAL_COMMUNITY): Payer: Self-pay | Admitting: Emergency Medicine

## 2013-12-07 ENCOUNTER — Emergency Department (HOSPITAL_COMMUNITY): Payer: Medicaid Other

## 2013-12-07 ENCOUNTER — Emergency Department (HOSPITAL_COMMUNITY)
Admission: EM | Admit: 2013-12-07 | Discharge: 2013-12-07 | Disposition: A | Discharge status: Home / Self Care | Payer: Medicaid Other | Attending: Emergency Medicine | Admitting: Emergency Medicine

## 2013-12-07 DIAGNOSIS — S93401A Sprain of unspecified ligament of right ankle, initial encounter: Secondary | ICD-10-CM

## 2013-12-07 DIAGNOSIS — F17200 Nicotine dependence, unspecified, uncomplicated: Secondary | ICD-10-CM | POA: Insufficient documentation

## 2013-12-07 DIAGNOSIS — W010XXA Fall on same level from slipping, tripping and stumbling without subsequent striking against object, initial encounter: Secondary | ICD-10-CM | POA: Insufficient documentation

## 2013-12-07 DIAGNOSIS — S93409A Sprain of unspecified ligament of unspecified ankle, initial encounter: Secondary | ICD-10-CM | POA: Insufficient documentation

## 2013-12-07 DIAGNOSIS — Y929 Unspecified place or not applicable: Secondary | ICD-10-CM | POA: Insufficient documentation

## 2013-12-07 DIAGNOSIS — Y9389 Activity, other specified: Secondary | ICD-10-CM | POA: Insufficient documentation

## 2013-12-07 MED ORDER — ACETAMINOPHEN-CODEINE #3 300-30 MG PO TABS: 1.0000 | ORAL_TABLET | Freq: Four times a day (QID) | ORAL | Status: DC | PRN

## 2013-12-07 MED ORDER — IBUPROFEN 800 MG PO TABS
800.0000 mg | ORAL_TABLET | Freq: Once | ORAL | Status: AC
  Administered 2013-12-07: 800 mg via ORAL
  Filled 2013-12-07: qty 1

## 2013-12-07 MED ORDER — DICLOFENAC SODIUM 75 MG PO TBEC: 75.0000 mg | DELAYED_RELEASE_TABLET | Freq: Two times a day (BID) | ORAL | Status: DC

## 2013-12-07 MED ORDER — ACETAMINOPHEN 325 MG PO TABS
650.0000 mg | ORAL_TABLET | Freq: Once | ORAL | Status: AC
  Administered 2013-12-07: 650 mg via ORAL
  Filled 2013-12-07: qty 2

## 2013-12-07 NOTE — ED Provider Notes (Signed)
CSN: [id number]     Arrival date & time 12/07/13  1021 History   First MD Initiated Contact with Patient 12/07/13 1049     Chief Complaint  Patient presents with  . Ankle Injury     (Consider location/radiation/quality/duration/timing/severity/associated sxs/prior Treatment) Patient is a 23 y.o. female presenting with lower extremity injury. The history is provided by the patient.  Ankle Injury This is a new problem. The current episode started in the past 7 days. The problem occurs intermittently. The problem has been gradually worsening. Associated symptoms include arthralgias. Pertinent negatives include no abdominal pain, chest pain, coughing, neck pain or numbness. The symptoms are aggravated by standing and walking. She has tried NSAIDs for the symptoms. The treatment provided no relief.    History reviewed. No pertinent past medical history. Past Surgical History  Procedure Laterality Date  . Cholecystectomy    . Tooth extraction     No family history on file. History  Substance Use Topics  . Smoking status: Current Every Day Smoker    Types: Cigarettes  . Smokeless tobacco: Not on file  . Alcohol Use: No   OB History   Grav Para Term Preterm Abortions TAB SAB Ect Mult Living                 Review of Systems  Constitutional: Negative for activity change.       All ROS Neg except as noted in HPI  HENT: Negative for nosebleeds.   Eyes: Negative for photophobia and discharge.  Respiratory: Negative for cough, shortness of breath and wheezing.   Cardiovascular: Negative for chest pain and palpitations.  Gastrointestinal: Negative for abdominal pain and blood in stool.  Genitourinary: Negative for dysuria, frequency and hematuria.  Musculoskeletal: Positive for arthralgias. Negative for back pain and neck pain.  Skin: Negative.   Neurological: Negative for dizziness, seizures, speech difficulty and numbness.  Psychiatric/Behavioral: Negative for hallucinations and  confusion.      Allergies  Tramadol  Home Medications   Current Outpatient Rx  Name  Route  Sig  Dispense  Refill  . ibuprofen (ADVIL,MOTRIN) 200 MG tablet   Oral   Take 400 mg by mouth every 6 (six) hours as needed for moderate pain. Pain          BP 113/77  Pulse 75  Temp(Src) 98 F (36.7 C) (Oral)  Resp 16  Ht 5\' 6"  (1.676 m)  Wt 250 lb (113.399 kg)  BMI 40.37 kg/m2  SpO2 100%  LMP 09/25/2013 Physical Exam  Nursing note and vitals reviewed. Constitutional: She is oriented to person, place, and time. She appears well-developed and well-nourished.  Non-toxic appearance.  HENT:  Head: Normocephalic.  Right Ear: Tympanic membrane and external ear normal.  Left Ear: Tympanic membrane and external ear normal.  Eyes: EOM and lids are normal. Pupils are equal, round, and reactive to light.  Neck: Normal range of motion. Neck supple. Carotid bruit is not present.  Cardiovascular: Normal rate, regular rhythm, normal heart sounds, intact distal pulses and normal pulses.   Pulmonary/Chest: Breath sounds normal. No respiratory distress.  Abdominal: Soft. Bowel sounds are normal. There is no tenderness. There is no guarding.  Musculoskeletal: Normal range of motion.  There is a new malleolus pain on the right ankle.there is lateral malleolus swelling. The Achilles tendon is intact on the right. The dorsalis pedis pulses 2+ bilaterally.  Lymphadenopathy:       Head (right side): No submandibular adenopathy present.  Head (left side): No submandibular adenopathy present.    She has no cervical adenopathy.  Neurological: She is alert and oriented to person, place, and time. She has normal strength. No cranial nerve deficit or sensory deficit.  Skin: Skin is warm and dry.  Psychiatric: She has a normal mood and affect. Her speech is normal.    ED Course  Procedures (including critical care time) Labs Review Labs Reviewed - No data to display Imaging Review Dg Ankle  Complete Right  12/07/2013   CLINICAL DATA:  Larey SeatFell 3 days ago, persistent medial ankle pain  EXAM: RIGHT ANKLE - COMPLETE 3+ VIEW  COMPARISON:  Prior ankle radiographs 12/03/2013  FINDINGS: There is no evidence of fracture, dislocation, or joint effusion. There is no evidence of arthropathy or other focal bone abnormality. Soft tissues are unremarkable.  IMPRESSION: Negative.  No new findings compared to 12/03/2013.   Electronically Signed   By: Malachy MoanHeath  McCullough M.D.   On: 12/07/2013 11:03   pulse oximetry is 100% on room air. Within normal limits by my interpretation.   EKG Interpretation None      MDM The patient sustained a slip on Monday, and then a fall on yesterday involving the right ankle. The patient thinks that she heard a pop was recently and has been having pain that is difficult to control with ibuprofen alone.  X-ray of the right ankle is negative for fracture or or dislocation.  Plan at this time the patient be fitted with a ankle stirrup splint. Patient is given an ice pack, asked to elevate the right ankle is much as possible. She will use ibuprofen 800 mg, and Tylenol Extra Strength for her discomfort. She will see orthopedics if not improving.    Final diagnoses:  None    **I have reviewed nursing notes, vital signs, and all appropriate lab and imaging results for this patient.Allison Love*    Aarya Robinson M Paz Fuentes, PA-C 12/07/13 1158

## 2013-12-07 NOTE — ED Provider Notes (Signed)
Medical screening examination/treatment/procedure(s) were performed by non-physician practitioner and as supervising physician I was immediately available for consultation/collaboration.   EKG Interpretation None      Devoria AlbeIva Deborah Lazcano, MD, Armando GangFACEP   Ward GivensIva L Keyasha Miah, MD 12/07/13 1159

## 2013-12-07 NOTE — ED Notes (Signed)
Pt states pain to right medial ankle after slipping and falling in mud last Monday. No obvious swelling noted. Pt is ambulatory on arrival.

## 2013-12-07 NOTE — Discharge Instructions (Signed)
Your x-ray is negative for fracture or dislocation. Please use the ankle stirrup splint for the next 10 days. Please apply ice today and tomorrow. Use diclofenac 2 times daily, use Tylenol codeine for more severe pain. Tylenol codeine may cause drowsiness. Please use with this medicine with caution. Please elevate your right ankle is much as possible. Please see Dr. Romeo Apple for additional evaluation if not improving. Ankle Sprain An ankle sprain is an injury to the strong, fibrous tissues (ligaments) that hold the bones of your ankle joint together.  CAUSES An ankle sprain is usually caused by a fall or by twisting your ankle. Ankle sprains most commonly occur when you step on the outer edge of your foot, and your ankle turns inward. People who participate in sports are more prone to these types of injuries.  SYMPTOMS   Pain in your ankle. The pain may be present at rest or only when you are trying to stand or walk.  Swelling.  Bruising. Bruising may develop immediately or within 1 to 2 days after your injury.  Difficulty standing or walking, particularly when turning corners or changing directions. DIAGNOSIS  Your caregiver will ask you details about your injury and perform a physical exam of your ankle to determine if you have an ankle sprain. During the physical exam, your caregiver will press on and apply pressure to specific areas of your foot and ankle. Your caregiver will try to move your ankle in certain ways. An X-ray exam may be done to be sure a bone was not broken or a ligament did not separate from one of the bones in your ankle (avulsion fracture).  TREATMENT  Certain types of braces can help stabilize your ankle. Your caregiver can make a recommendation for this. Your caregiver may recommend the use of medicine for pain. If your sprain is severe, your caregiver may refer you to a surgeon who helps to restore function to parts of your skeletal system (orthopedist) or a physical  therapist. HOME CARE INSTRUCTIONS   Apply ice to your injury for 1 2 days or as directed by your caregiver. Applying ice helps to reduce inflammation and pain.  Put ice in a plastic bag.  Place a towel between your skin and the bag.  Leave the ice on for 15-20 minutes at a time, every 2 hours while you are awake.  Only take over-the-counter or prescription medicines for pain, discomfort, or fever as directed by your caregiver.  Elevate your injured ankle above the level of your heart as much as possible for 2 3 days.  If your caregiver recommends crutches, use them as instructed. Gradually put weight on the affected ankle. Continue to use crutches or a cane until you can walk without feeling pain in your ankle.  If you have a plaster splint, wear the splint as directed by your caregiver. Do not rest it on anything harder than a pillow for the first 24 hours. Do not put weight on it. Do not get it wet. You may take it off to take a shower or bath.  You may have been given an elastic bandage to wear around your ankle to provide support. If the elastic bandage is too tight (you have numbness or tingling in your foot or your foot becomes cold and blue), adjust the bandage to make it comfortable.  If you have an air splint, you may blow more air into it or let air out to make it more comfortable. You may take your  splint off at night and before taking a shower or bath. Wiggle your toes in the splint several times per day to decrease swelling. SEEK MEDICAL CARE IF:   You have rapidly increasing bruising or swelling.  Your toes feel extremely cold or you lose feeling in your foot.  Your pain is not relieved with medicine. SEEK IMMEDIATE MEDICAL CARE IF:  Your toes are numb or blue.  You have severe pain that is increasing. MAKE SURE YOU:   Understand these instructions.  Will watch your condition.  Will get help right away if you are not doing well or get worse. Document Released:  09/12/2005 Document Revised: 06/06/2012 Document Reviewed: 09/24/2011 [facility] Patient Information 2014 [place]ExitCare, [place]LL[place].

## 2013-12-07 NOTE — ED Notes (Signed)
Pt with right ankle pain since Monday after slipping on mud and fell, good pedal pulse noted

## 2014-03-31 ENCOUNTER — Encounter (HOSPITAL_COMMUNITY): Payer: Self-pay | Admitting: Emergency Medicine

## 2014-03-31 ENCOUNTER — Emergency Department (HOSPITAL_COMMUNITY)
Admission: EM | Admit: 2014-03-31 | Discharge: 2014-03-31 | Disposition: A | Discharge status: Home / Self Care | Payer: Medicaid Other | Attending: Emergency Medicine | Admitting: Emergency Medicine

## 2014-03-31 ENCOUNTER — Emergency Department (HOSPITAL_COMMUNITY): Payer: Medicaid Other

## 2014-03-31 DIAGNOSIS — Y9302 Activity, running: Secondary | ICD-10-CM | POA: Insufficient documentation

## 2014-03-31 DIAGNOSIS — F17200 Nicotine dependence, unspecified, uncomplicated: Secondary | ICD-10-CM | POA: Diagnosis not present

## 2014-03-31 DIAGNOSIS — Y929 Unspecified place or not applicable: Secondary | ICD-10-CM | POA: Insufficient documentation

## 2014-03-31 DIAGNOSIS — W010XXA Fall on same level from slipping, tripping and stumbling without subsequent striking against object, initial encounter: Secondary | ICD-10-CM | POA: Insufficient documentation

## 2014-03-31 DIAGNOSIS — S86911A Strain of unspecified muscle(s) and tendon(s) at lower leg level, right leg, initial encounter: Secondary | ICD-10-CM

## 2014-03-31 DIAGNOSIS — X500XXA Overexertion from strenuous movement or load, initial encounter: Secondary | ICD-10-CM | POA: Insufficient documentation

## 2014-03-31 DIAGNOSIS — IMO0002 Reserved for concepts with insufficient information to code with codable children: Secondary | ICD-10-CM | POA: Diagnosis not present

## 2014-03-31 DIAGNOSIS — S99919A Unspecified injury of unspecified ankle, initial encounter: Secondary | ICD-10-CM | POA: Diagnosis present

## 2014-03-31 DIAGNOSIS — S8990XA Unspecified injury of unspecified lower leg, initial encounter: Secondary | ICD-10-CM | POA: Diagnosis present

## 2014-03-31 MED ORDER — ACETAMINOPHEN-CODEINE #3 300-30 MG PO TABS: 1.0000 | ORAL_TABLET | Freq: Four times a day (QID) | ORAL | Status: DC | PRN

## 2014-03-31 MED ORDER — IBUPROFEN 800 MG PO TABS: 800.0000 mg | ORAL_TABLET | Freq: Three times a day (TID) | ORAL | Status: DC

## 2014-03-31 NOTE — ED Notes (Signed)
Patient seen and assessed by ED PA 

## 2014-03-31 NOTE — ED Notes (Signed)
Fell on wet grass on Saturday,  Pain rt knee,

## 2014-03-31 NOTE — ED Provider Notes (Signed)
Medical screening examination/treatment/procedure(s) were performed by non-physician practitioner and as supervising physician I was immediately available for consultation/collaboration.   EKG Interpretation None        Yailene Badia L Titan Karner, MD 03/31/14 2350 

## 2014-03-31 NOTE — ED Provider Notes (Signed)
CSN: [id number]     Arrival date & time 03/31/14  1903 History   First MD Initiated Contact with Patient 03/31/14 2054     Chief Complaint  Patient presents with  . Fall     (Consider location/radiation/quality/duration/timing/severity/associated sxs/prior Treatment) HPI Comments: Patient is a 23 year old female who presents to the emergency apartment with complaint of right knee pain. The patient states that on Saturday, July 4, she was running down an embankment to get her young son when her feet slipped and she twisted her knee. She states she heard a loud pop and has been having pain since that time. She has not noted any significant swelling. She is able to ambulate on the knee, but states that it is uncomfortable at times. The patient denies any previous operations or procedures involving the knee. There were no other injuries related to the fall.  Patient is a 23 y.o. female presenting with fall. The history is provided by the patient.  Fall This is a new problem. The current episode started in the past 7 days. Associated symptoms include arthralgias. Pertinent negatives include no abdominal pain, chest pain, coughing or neck pain.    History reviewed. No pertinent past medical history. Past Surgical History  Procedure Laterality Date  . Cholecystectomy    . Tooth extraction     History reviewed. No pertinent family history. History  Substance Use Topics  . Smoking status: Current Every Day Smoker    Types: Cigarettes  . Smokeless tobacco: Not on file  . Alcohol Use: No   OB History   Grav Para Term Preterm Abortions TAB SAB Ect Mult Living                 Review of Systems  Constitutional: Negative for activity change.       All ROS Neg except as noted in HPI  HENT: Negative for nosebleeds.   Eyes: Negative for photophobia and discharge.  Respiratory: Negative for cough, shortness of breath and wheezing.   Cardiovascular: Negative for chest pain and palpitations.   Gastrointestinal: Negative for abdominal pain and blood in stool.  Genitourinary: Negative for dysuria, frequency and hematuria.  Musculoskeletal: Positive for arthralgias. Negative for back pain and neck pain.  Skin: Negative.   Neurological: Negative for dizziness, seizures and speech difficulty.  Psychiatric/Behavioral: Negative for hallucinations and confusion.      Allergies  Tramadol  Home Medications   Prior to Admission medications   Medication Sig Start Date End Date Taking? Authorizing Provider  ibuprofen (ADVIL,MOTRIN) 200 MG tablet Take 400 mg by mouth every 6 (six) hours as needed for moderate pain. Pain    Historical Provider, MD   BP 120/67  Pulse 94  Temp(Src) 98.8 F (37.1 C) (Oral)  Resp 18  Ht 5\' 6"  (1.676 m)  Wt 250 lb (113.399 kg)  BMI 40.37 kg/m2  SpO2 98%  LMP 03/17/2014 Physical Exam  Nursing note and vitals reviewed. Constitutional: She is oriented to person, place, and time. She appears well-developed and well-nourished.  Non-toxic appearance.  HENT:  Head: Normocephalic.  Right Ear: Tympanic membrane and external ear normal.  Left Ear: Tympanic membrane and external ear normal.  Eyes: EOM and lids are normal. Pupils are equal, round, and reactive to light.  Neck: Normal range of motion. Neck supple. Carotid bruit is not present.  Cardiovascular: Normal rate, regular rhythm, normal heart sounds, intact distal pulses and normal pulses.   Pulmonary/Chest: Breath sounds normal. No respiratory distress.  Abdominal: Soft.  Bowel sounds are normal. There is no tenderness. There is no guarding.  Musculoskeletal: Normal range of motion.       Right knee: She exhibits no swelling, no effusion, no deformity, no laceration and no erythema. Tenderness found. Lateral joint line tenderness noted. No patellar tendon tenderness noted.  There is mild to moderate crepitus with range of motion of the right knee. No effusion appreciated.  Lymphadenopathy:        Head (right side): No submandibular adenopathy present.       Head (left side): No submandibular adenopathy present.    She has no cervical adenopathy.  Neurological: She is alert and oriented to person, place, and time. She has normal strength. No cranial nerve deficit or sensory deficit.  Skin: Skin is warm and dry.  Psychiatric: She has a normal mood and affect. Her speech is normal.    ED Course  Procedures (including critical care time) Labs Review Labs Reviewed - No data to display  Imaging Review Dg Knee Complete 4 Views Right  03/31/2014   CLINICAL DATA:  Fall 2 days ago.  Persistent knee pain.  EXAM: RIGHT KNEE - COMPLETE 4+ VIEW  COMPARISON:  03/23/2014  FINDINGS: There is no evidence of fracture, dislocation, or joint effusion. There is no evidence of arthropathy or other focal bone abnormality. Soft tissues are unremarkable.  IMPRESSION: Negative.   Electronically Signed   By: Myles RosenthalJohn  Stahl M.D.   On: 03/31/2014 20:51     EKG Interpretation None      MDM X-ray of the right knee is negative for fracture, dislocation, or effusion. The patient has some crepitus with attempted range of motion of the right knee. The plan at this time is for the patient be fitted with a knee immobilizer. Crutches were offered, but declined at this time. The patient will be treated with ibuprofen 800 mg 3 times daily with a meal. Patient will also be given a prescription for Tylenol codeine one every 6 hours as needed for pain. Patient is referred to orthopedics for additional evaluation of his knee problem.    Final diagnoses:  None    *I have reviewed nursing notes, vital signs, and all appropriate lab and imaging results for this patient.Kathie Dike**    Trayven Lumadue M Aunika Kirsten, PA-C 03/31/14 2123

## 2014-03-31 NOTE — Discharge Instructions (Signed)
Your x-ray is negative for fracture or dislocation or joint effusion. Please use the knee immobilizer for the next 7-10 days. Please see the orthopedic specialist listed above, or the orthopedist of your choice if not improving. Please use ibuprofen 3 times daily after a meal. May use Tylenol codeine for pain if needed. This medication may cause drowsiness, please use with caution. Knee Pain Knee pain can be a result of an injury or other medical conditions. Treatment will depend on the cause of your pain. HOME CARE  Only take medicine as told by your doctor.  Keep a healthy weight. Being overweight can make the knee hurt more.  Stretch before exercising or playing sports.  If there is constant knee pain, change the way you exercise. Ask your doctor for advice.  Make sure shoes fit well. Choose the right shoe for the sport or activity.  Protect your knees. Wear kneepads if needed.  Rest when you are tired. GET HELP RIGHT AWAY IF:   Your knee pain does not stop.  Your knee pain does not get better.  Your knee joint feels hot to the touch.  You have a fever. MAKE SURE YOU:   Understand these instructions.  Will watch this condition.  Will get help right away if you are not doing well or get worse. Document Released: 12/09/2008 Document Revised: 12/05/2011 Document Reviewed: 12/09/2008 [facility]Exit[facility] Patient Information 2015 [place]ExitCare, [place]LL[place]. This information is not intended to replace advice given to you by your health care provider. Make sure you discuss any questions you have with your health care provider.

## 2014-07-15 IMAGING — CR DG KNEE COMPLETE 4+V*R*
4 series · 4 of 4 positions shown · non-contrast
Comparison: 03/23/2014

CLINICAL DATA: Fall 2 days ago.  Persistent knee pain.

EXAM:
RIGHT KNEE - COMPLETE 4+ VIEW

[view not recorded (1 of 4)]
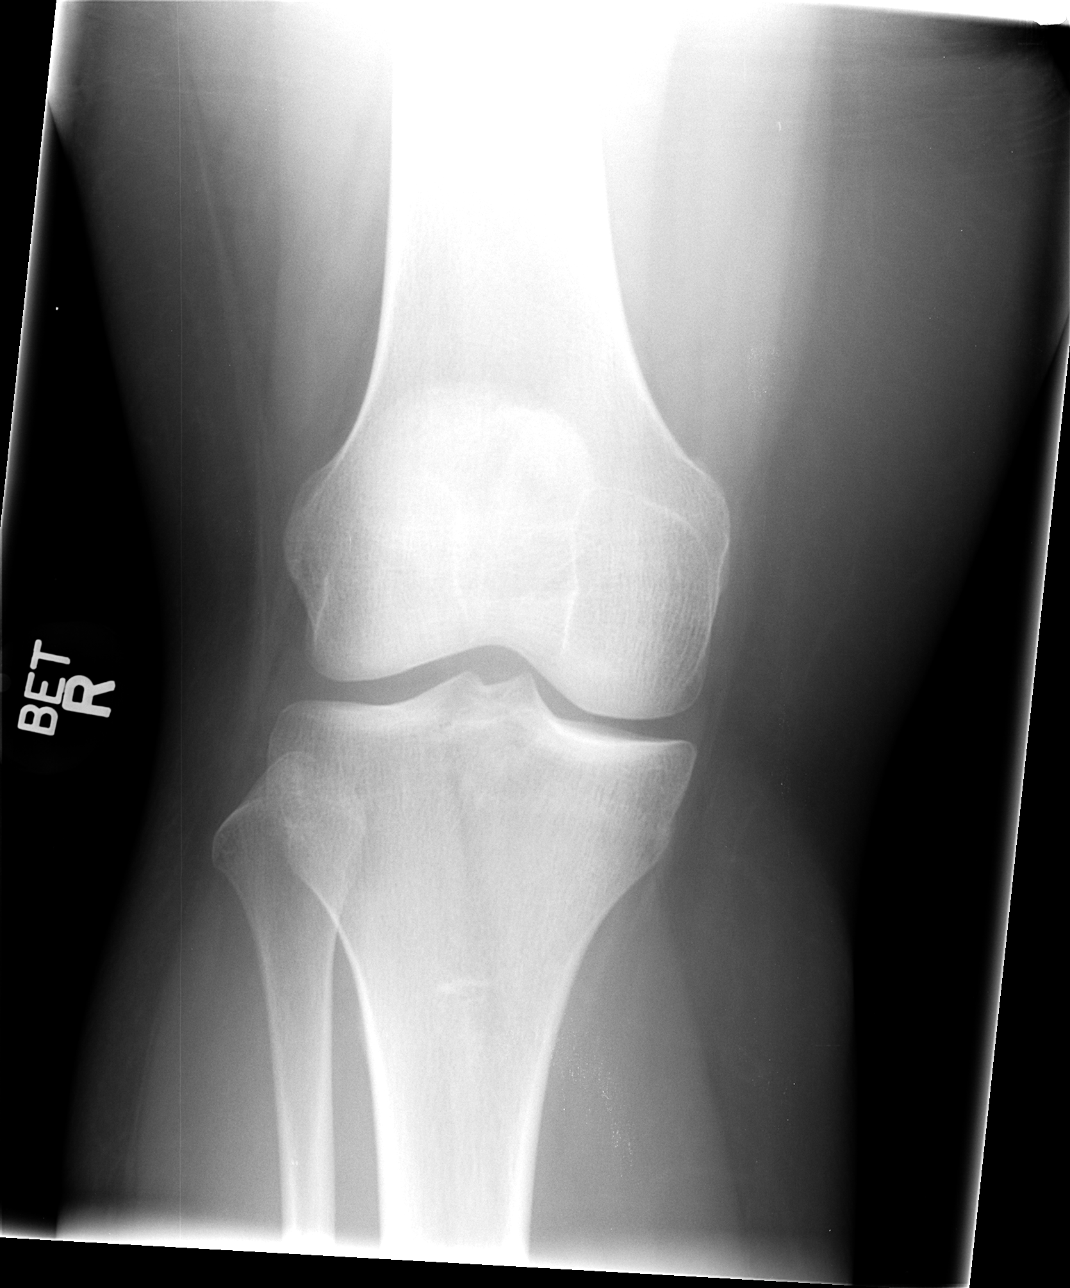

[view not recorded (2 of 4)]
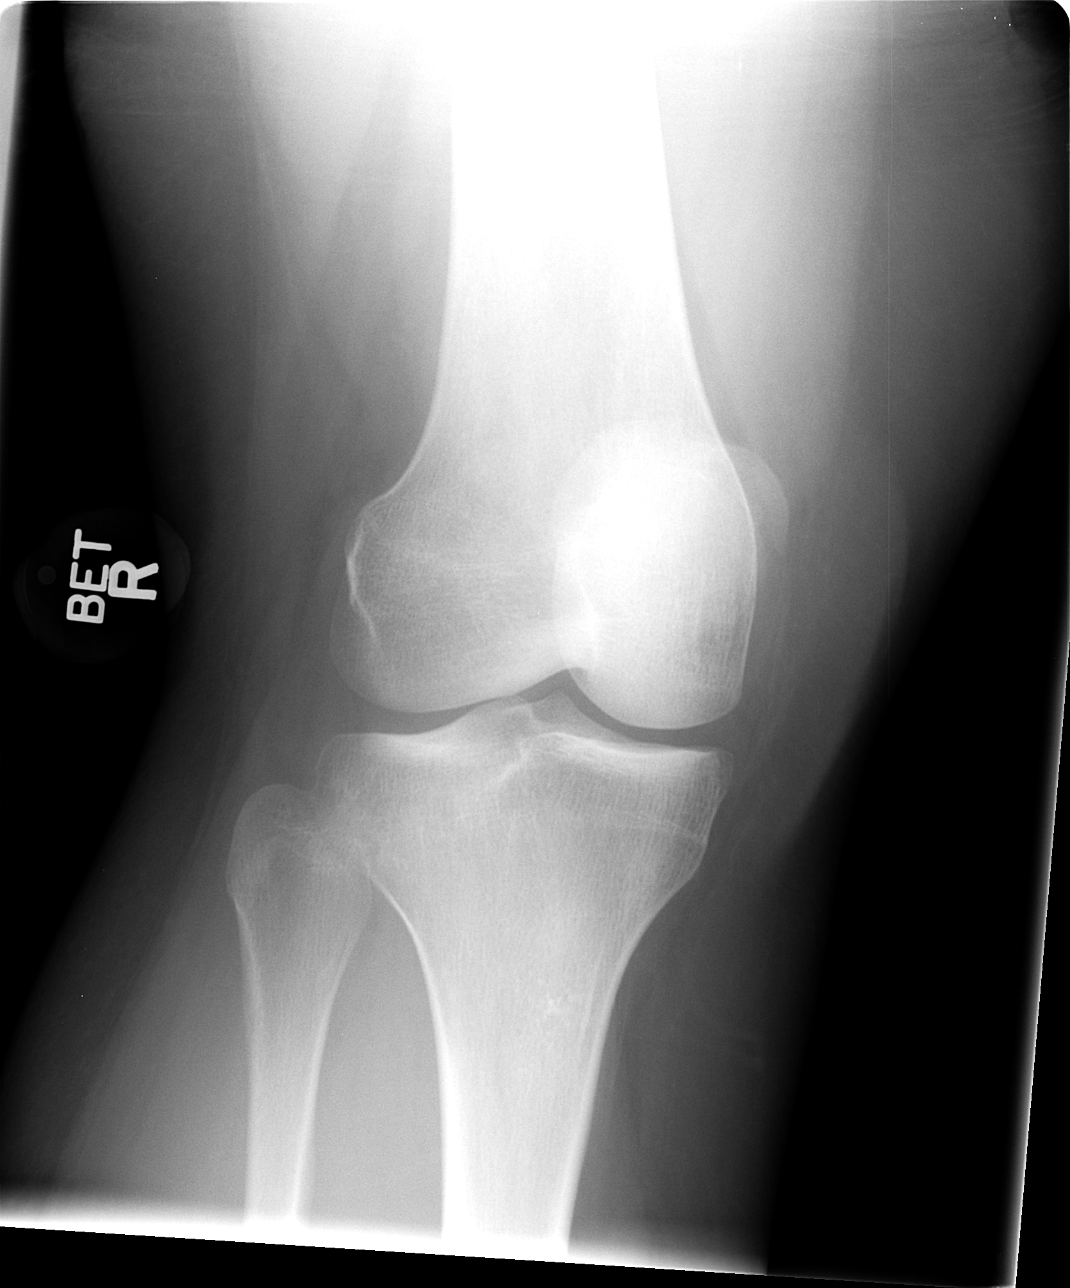

[view not recorded (3 of 4)]
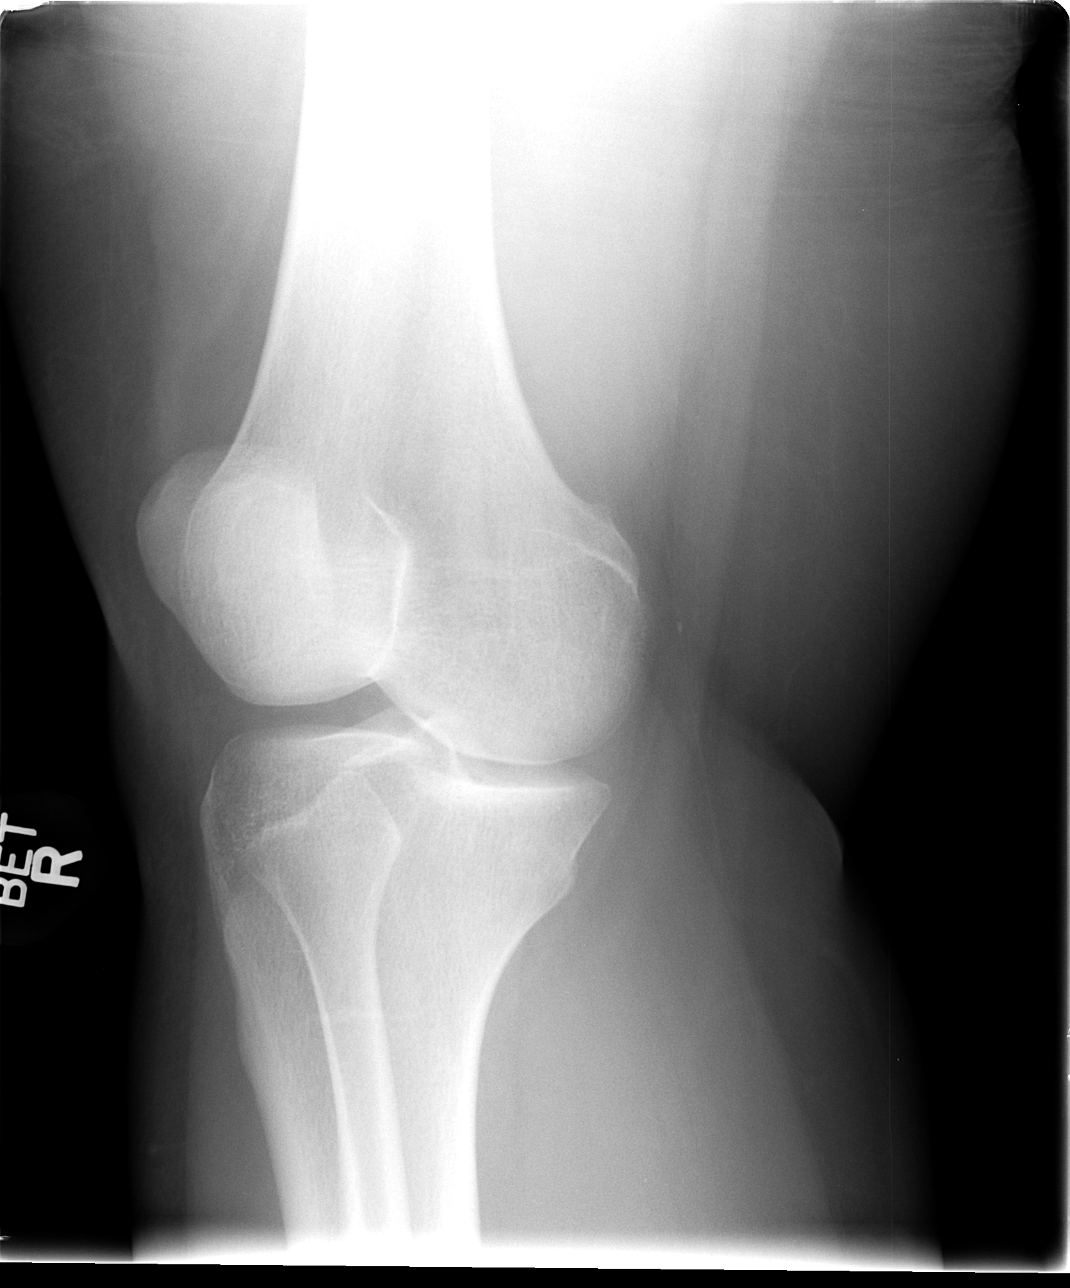

[view not recorded (4 of 4)]
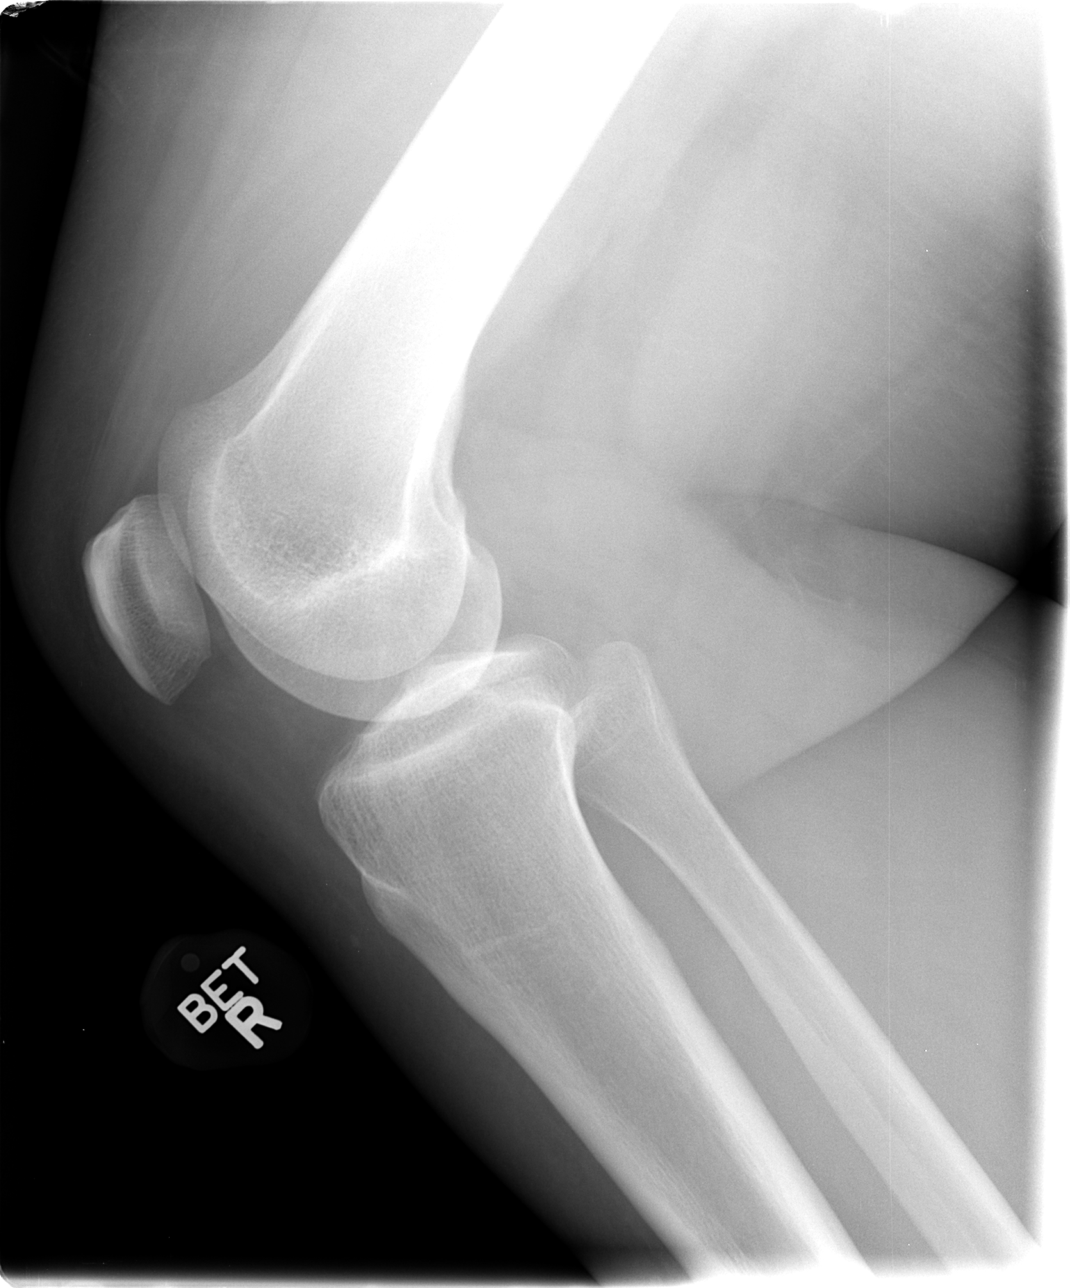

[4 of 4 positions shown; findings below may reference images not displayed]

FINDINGS: There is no evidence of fracture, dislocation, or joint effusion.
There is no evidence of arthropathy or other focal bone abnormality.
Soft tissues are unremarkable.
IMPRESSION: Negative.

## 2014-12-12 ENCOUNTER — Emergency Department (HOSPITAL_COMMUNITY)
Admission: EM | Admit: 2014-12-12 | Discharge: 2014-12-12 | Disposition: A | Discharge status: Home / Self Care | Payer: Medicaid Other | Attending: Emergency Medicine | Admitting: Emergency Medicine

## 2014-12-12 ENCOUNTER — Encounter (HOSPITAL_COMMUNITY): Payer: Self-pay | Admitting: *Deleted

## 2014-12-12 DIAGNOSIS — Y9389 Activity, other specified: Secondary | ICD-10-CM | POA: Insufficient documentation

## 2014-12-12 DIAGNOSIS — Z720 Tobacco use: Secondary | ICD-10-CM | POA: Insufficient documentation

## 2014-12-12 DIAGNOSIS — X58XXXA Exposure to other specified factors, initial encounter: Secondary | ICD-10-CM | POA: Diagnosis not present

## 2014-12-12 DIAGNOSIS — Y9289 Other specified places as the place of occurrence of the external cause: Secondary | ICD-10-CM | POA: Diagnosis not present

## 2014-12-12 DIAGNOSIS — M545 Low back pain, unspecified: Secondary | ICD-10-CM

## 2014-12-12 DIAGNOSIS — Y998 Other external cause status: Secondary | ICD-10-CM | POA: Insufficient documentation

## 2014-12-12 DIAGNOSIS — S3992XA Unspecified injury of lower back, initial encounter: Secondary | ICD-10-CM | POA: Insufficient documentation

## 2014-12-12 MED ORDER — CYCLOBENZAPRINE HCL 10 MG PO TABS
10.0000 mg | ORAL_TABLET | Freq: Once | ORAL | Status: AC
  Administered 2014-12-12: 10 mg via ORAL
  Filled 2014-12-12: qty 1

## 2014-12-12 MED ORDER — HYDROCODONE-ACETAMINOPHEN 5-325 MG PO TABS
1.0000 | ORAL_TABLET | Freq: Once | ORAL | Status: AC
  Administered 2014-12-12: 1 via ORAL
  Filled 2014-12-12: qty 1

## 2014-12-12 MED ORDER — CYCLOBENZAPRINE HCL 10 MG PO TABS: 10.0000 mg | ORAL_TABLET | Freq: Two times a day (BID) | ORAL | Status: DC | PRN

## 2014-12-12 MED ORDER — PREDNISONE 10 MG PO TABS: 20.0000 mg | ORAL_TABLET | Freq: Two times a day (BID) | ORAL | Status: DC

## 2014-12-12 NOTE — ED Notes (Signed)
Felt "pop" in L lower back yesterday after lifting dog house.  8/10 pain radiating into L buttock and into L posterior thigh.  Has taken 800mg  ibuprofen x 3 since w/no improvement.

## 2014-12-12 NOTE — Discharge Instructions (Signed)
Follow up with your doctor for recheck of the back injury next week. Return sooner for problems.

## 2014-12-12 NOTE — ED Provider Notes (Signed)
CSN: [id number]     Arrival date & time 12/12/14  1126 History   First MD Initiated Contact with Patient 12/12/14 1211     Chief Complaint  Patient presents with  . Back Pain     (Consider location/radiation/quality/duration/timing/severity/associated sxs/prior Treatment) Patient is a 24 y.o. female presenting with back pain. The history is provided by the patient.  Back Pain Location:  Lumbar spine Quality:  Aching Radiates to:  L posterior upper leg Pain severity:  Severe Pain is:  Same all the time Onset quality:  Sudden Duration:  1 day Timing:  Constant Progression:  Worsening Chronicity:  New Context: physical stress   Worsened by:  Ambulation, movement and bending Ineffective treatments:  NSAIDs Associated symptoms: leg pain   Associated symptoms: no bladder incontinence, no bowel incontinence and no dysuria    AMISADAI WOODFORD is a 24 y.o. female who presents to the EDD with lower back pain that started yesterday after she was lifting a dog house. Patient states that her dog got caught under his house and she lifted it to get him out. She felt a pop in her lower back and immediate pain. The pain has continued despite ibuprofen 800 mg every 8 hours.   History reviewed. No pertinent past medical history. Past Surgical History  Procedure Laterality Date  . Cholecystectomy    . Tooth extraction     History reviewed. No pertinent family history. History  Substance Use Topics  . Smoking status: Current Every Day Smoker -- 1.00 packs/day    Types: Cigarettes  . Smokeless tobacco: Not on file  . Alcohol Use: No   OB History    No data available     Review of Systems  Gastrointestinal: Negative for bowel incontinence.  Genitourinary: Negative for bladder incontinence and dysuria.  Musculoskeletal: Positive for back pain.  all other systems negative    Allergies  Tramadol  Home Medications   Prior to Admission medications   Medication Sig Start Date End  Date Taking? Authorizing Provider  ibuprofen (ADVIL,MOTRIN) 200 MG tablet Take 800 mg by mouth every 6 (six) hours as needed for moderate pain. Pain   Yes Historical Provider, MD  cyclobenzaprine (FLEXERIL) 10 MG tablet Take 1 tablet (10 mg total) by mouth 2 (two) times daily as needed for muscle spasms. 12/12/14   Hope Orlene Och, NP  predniSONE (DELTASONE) 10 MG tablet Take 2 tablets (20 mg total) by mouth 2 (two) times daily with a meal. 12/12/14   Hope Orlene Och, NP   BP 99/63 mmHg  Pulse 74  Temp(Src) 98.3 F (36.8 C) (Oral)  Resp 18  SpO2 100%  LMP 11/28/2014 Physical Exam  Constitutional: She is oriented to person, place, and time. She appears well-developed and well-nourished. No distress.  HENT:  Head: Normocephalic and atraumatic.  Right Ear: Tympanic membrane normal.  Left Ear: Tympanic membrane normal.  Nose: Nose normal.  Mouth/Throat: Uvula is midline, oropharynx is clear and moist and mucous membranes are normal.  Eyes: EOM are normal.  Neck: Normal range of motion. Neck supple.  Cardiovascular: Normal rate and regular rhythm.   Pulmonary/Chest: Effort normal. She has no wheezes. She has no rales.  Abdominal: Soft. Bowel sounds are normal. There is no tenderness.  Musculoskeletal: Normal range of motion.       Lumbar back: She exhibits tenderness, pain and spasm. She exhibits normal pulse.       Back:  There is no tenderness over the spine. There is tenderness  and spasm of the lower left lumbar area that extends to the left buttock.   Neurological: She is alert and oriented to person, place, and time. She has normal strength. No cranial nerve deficit or sensory deficit. Gait normal.  Reflex Scores:      Bicep reflexes are 2+ on the right side and 2+ on the left side.      Brachioradialis reflexes are 2+ on the right side and 2+ on the left side.      Patellar reflexes are 2+ on the right side and 2+ on the left side.      Achilles reflexes are 2+ on the right side and 2+ on  the left side. Skin: Skin is warm and dry.  Psychiatric: She has a normal mood and affect. Her behavior is normal.  Nursing note and vitals reviewed.   ED Course  Procedures   MDM  24 y.o. female with low back pain s/p injury yesterday. Stable for d/c without any focal neruo deficits. There is no pain with palpation of the spine. Will treat for muscle strain. She is to follow up with her PCP next week for recheck. Discussed with the patient and all questioned fully answered. She will return here if any problems arise.  Final diagnoses:  Lumbosacral pain        Janne NapoleonHope M Neese, NP 12/12/14 1244  Shon Batonourtney F Horton, MD 12/13/14 2037

## 2015-02-18 ENCOUNTER — Telehealth: Payer: Self-pay | Admitting: Family Medicine

## 2015-02-24 NOTE — Telephone Encounter (Signed)
Several attempts have been made to contact patient this encounter will be closed.  

## 2016-02-17 ENCOUNTER — Encounter (HOSPITAL_COMMUNITY): Payer: Self-pay | Admitting: Emergency Medicine

## 2016-02-17 ENCOUNTER — Emergency Department (HOSPITAL_COMMUNITY)
Admission: EM | Admit: 2016-02-17 | Discharge: 2016-02-17 | Disposition: A | Discharge status: Home / Self Care | Payer: Medicaid Other | Attending: Emergency Medicine | Admitting: Emergency Medicine

## 2016-02-17 DIAGNOSIS — M79641 Pain in right hand: Secondary | ICD-10-CM | POA: Diagnosis present

## 2016-02-17 DIAGNOSIS — Z79899 Other long term (current) drug therapy: Secondary | ICD-10-CM | POA: Insufficient documentation

## 2016-02-17 DIAGNOSIS — G5601 Carpal tunnel syndrome, right upper limb: Secondary | ICD-10-CM | POA: Diagnosis not present

## 2016-02-17 DIAGNOSIS — F17210 Nicotine dependence, cigarettes, uncomplicated: Secondary | ICD-10-CM | POA: Diagnosis not present

## 2016-02-17 HISTORY — DX: Unspecified viral hepatitis C without hepatic coma: B19.20

## 2016-02-17 MED ORDER — NAPROXEN 500 MG PO TABS: 500.0000 mg | ORAL_TABLET | Freq: Two times a day (BID) | ORAL | Status: DC

## 2016-02-17 MED ORDER — MORPHINE SULFATE 15 MG PO TABS: 15.0000 mg | ORAL_TABLET | Freq: Four times a day (QID) | ORAL | Status: DC | PRN

## 2016-02-17 MED ORDER — NAPROXEN 500 MG PO TABS: 500.0000 mg | ORAL_TABLET | Freq: Two times a day (BID) | ORAL | Status: AC

## 2016-02-17 NOTE — ED Notes (Signed)
Splint applied,Patient given discharge instruction, verbalized understand. Patient ambulatory out of the department.  

## 2016-02-17 NOTE — Discharge Instructions (Signed)

## 2016-02-17 NOTE — ED Notes (Signed)
Pt c/o intermittent right hand pain/numbness/swelling x 6 months.

## 2016-02-17 NOTE — ED Provider Notes (Signed)
CSN: [id number]     Arrival date & time 02/17/16  0941 History   First MD Initiated Contact with Patient 02/17/16 [phone]     Chief Complaint  Patient presents with  . Hand Pain     (Consider location/radiation/quality/duration/timing/severity/associated sxs/prior Treatment) HPI  Allison Love is a 25 y.o. female who complains of numbness of lateral aspect of right hand, especially with use of the hand and at night. C/o sever numbness and tingling. She has difficulty holding object with that hand. It has been going on for 6  Months and is now severe after progression of the course.    Past Medical History  Diagnosis Date  . Hepatitis C    Past Surgical History  Procedure Laterality Date  . Cholecystectomy    . Tooth extraction     No family history on file. Social History  Substance Use Topics  . Smoking status: Current Every Day Smoker -- 1.00 packs/day    Types: Cigarettes  . Smokeless tobacco: None  . Alcohol Use: No   OB History    No data available     Review of Systems  Ten systems reviewed and are negative for acute change, except as noted in the HPI.    Allergies  Review of patient's allergies indicates no active allergies.  Home Medications   Prior to Admission medications   Medication Sig Start Date End Date Taking? Authorizing Provider  Ledipasvir-Sofosbuvir (HARVONI) 90-400 MG TABS Take by mouth.   Yes Historical Provider, MD  omeprazole (PRILOSEC) 20 MG capsule Take 20 mg by mouth daily.   Yes Historical Provider, MD  morphine (MSIR) 15 MG tablet Take 1 tablet (15 mg total) by mouth every 6 (six) hours as needed for severe pain. 02/17/16   Arthor CaptainAbigail Evaristo Tsuda, PA-C  naproxen (NAPROSYN) 500 MG tablet Take 1 tablet (500 mg total) by mouth 2 (two) times daily with a meal. 02/17/16   Arthor CaptainAbigail Debra Calabretta, PA-C   BP 117/68 mmHg  Pulse 65  Temp(Src) 98.1 F (36.7 C)  Resp 18  Ht 5\' 6"  (1.676 m)  Wt 103.42 kg  BMI 36.82 kg/m2  SpO2 98%  LMP  02/17/2016 Physical Exam  Constitutional: She is oriented to person, place, and time. She appears well-developed and well-nourished. No distress.  HENT:  Head: Normocephalic and atraumatic.  Eyes: Conjunctivae are normal. No scleral icterus.  Neck: Normal range of motion.  Cardiovascular: Normal rate, regular rhythm and normal heart sounds.  Exam reveals no gallop and no friction rub.   No murmur heard. Pulmonary/Chest: Effort normal and breath sounds normal. No respiratory distress.  Abdominal: Soft. Bowel sounds are normal. She exhibits no distension and no mass. There is no tenderness. There is no guarding.  Musculoskeletal:  Hand exam revealed bilaterally normal motor power and no atrophy.   RIGHT hand: Phalen's sign positive - Tinel's sign is positive. LEFT hand: Phalen is negative - Tinel is negative. Normal radial pulse BL  Neurological: She is alert and oriented to person, place, and time.  Skin: Skin is warm and dry. She is not diaphoretic.  Nursing note and vitals reviewed.   ED Course  Procedures (including critical care time) Labs Review Labs Reviewed - No data to display  Imaging Review No results found. I have personally reviewed and evaluated these images and lab results as part of my medical decision-making.   EKG Interpretation None      MDM   Final diagnoses:  Carpal tunnel syndrome of right wrist  Explanation of median nerve entrapment is given. A wrist splint is provided for prn use, especially at night.  The treatment spectrum is discussed, including possible surgical release if the symptoms are persistent and severe.  F/U with ortho.  Arthor Captain, PA-C 02/17/16 1027  Zadie Rhine, MD 02/17/16 1145
